# Patient Record
Sex: Female | Born: 1955 | Race: White | Hispanic: No | Marital: Married | Smoking: Never smoker
Health system: Southern US, Community
[De-identification: ages and names within clinical notes are randomized; demographics above are authoritative.]

## PROBLEM LIST (undated history)

## (undated) DIAGNOSIS — D219 Benign neoplasm of connective and other soft tissue, unspecified: Secondary | ICD-10-CM

## (undated) DIAGNOSIS — M1990 Unspecified osteoarthritis, unspecified site: Secondary | ICD-10-CM

## (undated) DIAGNOSIS — IMO0002 Reserved for concepts with insufficient information to code with codable children: Secondary | ICD-10-CM

## (undated) DIAGNOSIS — K219 Gastro-esophageal reflux disease without esophagitis: Secondary | ICD-10-CM

## (undated) HISTORY — PX: OTHER SURGICAL HISTORY: SHX169

## (undated) HISTORY — DX: Benign neoplasm of connective and other soft tissue, unspecified: D21.9

## (undated) HISTORY — DX: Reserved for concepts with insufficient information to code with codable children: IMO0002

## (undated) HISTORY — DX: Unspecified osteoarthritis, unspecified site: M19.90

## (undated) HISTORY — PX: BREAST CYST EXCISION: SHX579

---

## 1999-03-16 ENCOUNTER — Encounter: Payer: Self-pay | Admitting: Gynecology

## 1999-03-16 ENCOUNTER — Encounter: Admission: RE | Admit: 1999-03-16 | Discharge: 1999-03-16 | Payer: Self-pay | Admitting: Gynecology

## 1999-05-19 ENCOUNTER — Encounter: Payer: Self-pay | Admitting: Gynecology

## 1999-05-19 ENCOUNTER — Encounter: Admission: RE | Admit: 1999-05-19 | Discharge: 1999-05-19 | Payer: Self-pay | Admitting: Gynecology

## 1999-11-28 ENCOUNTER — Encounter: Admission: RE | Admit: 1999-11-28 | Discharge: 1999-11-28 | Payer: Self-pay | Admitting: Gynecology

## 1999-11-28 ENCOUNTER — Encounter: Payer: Self-pay | Admitting: Gynecology

## 2000-12-18 ENCOUNTER — Encounter: Admission: RE | Admit: 2000-12-18 | Discharge: 2000-12-18 | Payer: Self-pay | Admitting: Gynecology

## 2000-12-18 ENCOUNTER — Encounter: Payer: Self-pay | Admitting: Gynecology

## 2001-05-20 ENCOUNTER — Other Ambulatory Visit: Admission: RE | Admit: 2001-05-20 | Discharge: 2001-05-20 | Payer: Self-pay | Admitting: Gynecology

## 2001-12-23 ENCOUNTER — Encounter: Payer: Self-pay | Admitting: Gynecology

## 2001-12-23 ENCOUNTER — Encounter: Admission: RE | Admit: 2001-12-23 | Discharge: 2001-12-23 | Payer: Self-pay | Admitting: Gynecology

## 2002-06-16 ENCOUNTER — Other Ambulatory Visit: Admission: RE | Admit: 2002-06-16 | Discharge: 2002-06-16 | Payer: Self-pay | Admitting: Gynecology

## 2003-01-06 ENCOUNTER — Encounter: Admission: RE | Admit: 2003-01-06 | Discharge: 2003-01-06 | Payer: Self-pay | Admitting: Gynecology

## 2003-05-07 ENCOUNTER — Encounter: Admission: RE | Admit: 2003-05-07 | Discharge: 2003-05-07 | Payer: Self-pay | Admitting: Orthopedic Surgery

## 2003-05-20 ENCOUNTER — Encounter: Admission: RE | Admit: 2003-05-20 | Discharge: 2003-05-20 | Payer: Self-pay | Admitting: Orthopedic Surgery

## 2003-10-12 ENCOUNTER — Other Ambulatory Visit: Admission: RE | Admit: 2003-10-12 | Discharge: 2003-10-12 | Payer: Self-pay | Admitting: Gynecology

## 2004-01-21 ENCOUNTER — Ambulatory Visit: Payer: Self-pay | Admitting: Internal Medicine

## 2004-02-11 ENCOUNTER — Encounter: Admission: RE | Admit: 2004-02-11 | Discharge: 2004-02-11 | Payer: Self-pay | Admitting: Gynecology

## 2004-03-02 ENCOUNTER — Encounter: Admission: RE | Admit: 2004-03-02 | Discharge: 2004-03-02 | Payer: Self-pay | Admitting: Gynecology

## 2004-09-25 ENCOUNTER — Other Ambulatory Visit: Admission: RE | Admit: 2004-09-25 | Discharge: 2004-09-25 | Payer: Self-pay | Admitting: Gynecology

## 2004-10-06 ENCOUNTER — Ambulatory Visit: Payer: Self-pay | Admitting: Internal Medicine

## 2004-10-23 ENCOUNTER — Ambulatory Visit: Payer: Self-pay | Admitting: Endocrinology

## 2004-12-07 ENCOUNTER — Ambulatory Visit: Payer: Self-pay | Admitting: Endocrinology

## 2004-12-13 ENCOUNTER — Ambulatory Visit: Payer: Self-pay | Admitting: Endocrinology

## 2005-05-03 ENCOUNTER — Encounter: Admission: RE | Admit: 2005-05-03 | Discharge: 2005-05-03 | Payer: Self-pay | Admitting: Gynecology

## 2005-07-16 ENCOUNTER — Ambulatory Visit: Payer: Self-pay | Admitting: Internal Medicine

## 2005-10-02 ENCOUNTER — Other Ambulatory Visit: Admission: RE | Admit: 2005-10-02 | Discharge: 2005-10-02 | Payer: Self-pay | Admitting: Gynecology

## 2006-06-13 ENCOUNTER — Encounter: Admission: RE | Admit: 2006-06-13 | Discharge: 2006-06-13 | Payer: Self-pay | Admitting: Gynecology

## 2006-07-16 ENCOUNTER — Ambulatory Visit: Payer: Self-pay | Admitting: Internal Medicine

## 2006-08-30 ENCOUNTER — Ambulatory Visit: Payer: Self-pay | Admitting: Internal Medicine

## 2006-10-09 ENCOUNTER — Other Ambulatory Visit: Admission: RE | Admit: 2006-10-09 | Discharge: 2006-10-09 | Payer: Self-pay | Admitting: Obstetrics and Gynecology

## 2007-03-06 HISTORY — PX: HAND SURGERY: SHX662

## 2007-07-17 ENCOUNTER — Encounter: Admission: RE | Admit: 2007-07-17 | Discharge: 2007-07-17 | Payer: Self-pay | Admitting: Gynecology

## 2007-07-24 ENCOUNTER — Ambulatory Visit: Payer: Self-pay | Admitting: Endocrinology

## 2007-07-24 DIAGNOSIS — M810 Age-related osteoporosis without current pathological fracture: Secondary | ICD-10-CM

## 2007-07-29 ENCOUNTER — Encounter: Payer: Self-pay | Admitting: Endocrinology

## 2007-07-29 DIAGNOSIS — D72819 Decreased white blood cell count, unspecified: Secondary | ICD-10-CM | POA: Insufficient documentation

## 2007-07-29 DIAGNOSIS — M4120 Other idiopathic scoliosis, site unspecified: Secondary | ICD-10-CM | POA: Insufficient documentation

## 2007-07-29 DIAGNOSIS — J309 Allergic rhinitis, unspecified: Secondary | ICD-10-CM | POA: Insufficient documentation

## 2007-07-29 DIAGNOSIS — Z8719 Personal history of other diseases of the digestive system: Secondary | ICD-10-CM | POA: Insufficient documentation

## 2007-07-29 DIAGNOSIS — J40 Bronchitis, not specified as acute or chronic: Secondary | ICD-10-CM | POA: Insufficient documentation

## 2007-07-30 ENCOUNTER — Ambulatory Visit: Payer: Self-pay | Admitting: Endocrinology

## 2007-07-30 DIAGNOSIS — H9319 Tinnitus, unspecified ear: Secondary | ICD-10-CM | POA: Insufficient documentation

## 2007-07-30 DIAGNOSIS — G4700 Insomnia, unspecified: Secondary | ICD-10-CM | POA: Insufficient documentation

## 2007-08-06 ENCOUNTER — Encounter: Payer: Self-pay | Admitting: Endocrinology

## 2007-08-06 ENCOUNTER — Ambulatory Visit: Payer: Self-pay | Admitting: Internal Medicine

## 2007-08-08 ENCOUNTER — Ambulatory Visit: Payer: Self-pay | Admitting: Gastroenterology

## 2007-08-14 ENCOUNTER — Telehealth (INDEPENDENT_AMBULATORY_CARE_PROVIDER_SITE_OTHER): Payer: Self-pay | Admitting: *Deleted

## 2007-08-15 ENCOUNTER — Telehealth (INDEPENDENT_AMBULATORY_CARE_PROVIDER_SITE_OTHER): Payer: Self-pay | Admitting: *Deleted

## 2007-08-22 ENCOUNTER — Ambulatory Visit: Payer: Self-pay | Admitting: Gastroenterology

## 2007-10-10 ENCOUNTER — Other Ambulatory Visit: Admission: RE | Admit: 2007-10-10 | Discharge: 2007-10-10 | Payer: Self-pay | Admitting: Gynecology

## 2007-12-09 ENCOUNTER — Ambulatory Visit: Payer: Self-pay | Admitting: Endocrinology

## 2008-02-16 ENCOUNTER — Ambulatory Visit: Payer: Self-pay | Admitting: Endocrinology

## 2008-02-23 ENCOUNTER — Ambulatory Visit (HOSPITAL_BASED_OUTPATIENT_CLINIC_OR_DEPARTMENT_OTHER): Admission: RE | Admit: 2008-02-23 | Discharge: 2008-02-23 | Payer: Self-pay | Admitting: *Deleted

## 2008-03-31 ENCOUNTER — Telehealth (INDEPENDENT_AMBULATORY_CARE_PROVIDER_SITE_OTHER): Payer: Self-pay | Admitting: *Deleted

## 2008-04-12 ENCOUNTER — Telehealth (INDEPENDENT_AMBULATORY_CARE_PROVIDER_SITE_OTHER): Payer: Self-pay | Admitting: *Deleted

## 2008-06-03 ENCOUNTER — Telehealth (INDEPENDENT_AMBULATORY_CARE_PROVIDER_SITE_OTHER): Payer: Self-pay | Admitting: *Deleted

## 2008-06-09 ENCOUNTER — Other Ambulatory Visit: Admission: RE | Admit: 2008-06-09 | Discharge: 2008-06-09 | Payer: Self-pay | Admitting: Gynecology

## 2008-06-09 ENCOUNTER — Ambulatory Visit: Payer: Self-pay | Admitting: Gynecology

## 2008-06-09 ENCOUNTER — Encounter: Payer: Self-pay | Admitting: Gynecology

## 2008-06-11 ENCOUNTER — Ambulatory Visit: Payer: Self-pay | Admitting: Internal Medicine

## 2008-06-17 ENCOUNTER — Encounter: Payer: Self-pay | Admitting: Internal Medicine

## 2008-06-23 ENCOUNTER — Ambulatory Visit: Payer: Self-pay | Admitting: Gynecology

## 2008-06-23 ENCOUNTER — Other Ambulatory Visit: Admission: RE | Admit: 2008-06-23 | Discharge: 2008-06-23 | Payer: Self-pay | Admitting: Gynecology

## 2008-06-23 ENCOUNTER — Encounter: Payer: Self-pay | Admitting: Gynecology

## 2008-06-24 ENCOUNTER — Encounter: Admission: RE | Admit: 2008-06-24 | Discharge: 2008-06-24 | Payer: Self-pay | Admitting: Gynecology

## 2008-10-03 DIAGNOSIS — D219 Benign neoplasm of connective and other soft tissue, unspecified: Secondary | ICD-10-CM

## 2008-10-03 HISTORY — DX: Benign neoplasm of connective and other soft tissue, unspecified: D21.9

## 2008-10-11 ENCOUNTER — Ambulatory Visit: Payer: Self-pay | Admitting: Women's Health

## 2008-10-11 ENCOUNTER — Other Ambulatory Visit: Admission: RE | Admit: 2008-10-11 | Discharge: 2008-10-11 | Payer: Self-pay | Admitting: Obstetrics and Gynecology

## 2008-10-11 ENCOUNTER — Encounter: Payer: Self-pay | Admitting: Women's Health

## 2008-10-20 ENCOUNTER — Ambulatory Visit: Payer: Self-pay | Admitting: Women's Health

## 2008-12-21 ENCOUNTER — Ambulatory Visit: Payer: Self-pay | Admitting: Endocrinology

## 2008-12-21 DIAGNOSIS — R05 Cough: Secondary | ICD-10-CM

## 2008-12-21 DIAGNOSIS — R059 Cough, unspecified: Secondary | ICD-10-CM | POA: Insufficient documentation

## 2009-06-27 ENCOUNTER — Encounter: Admission: RE | Admit: 2009-06-27 | Discharge: 2009-06-27 | Payer: Self-pay | Admitting: Gynecology

## 2009-08-19 ENCOUNTER — Ambulatory Visit: Payer: Self-pay | Admitting: Endocrinology

## 2009-08-20 LAB — CONVERTED CEMR LAB
Alkaline Phosphatase: 65 units/L (ref 39–117)
BUN: 26 mg/dL — ABNORMAL HIGH (ref 6–23)
Basophils Absolute: 0 10*3/uL (ref 0.0–0.1)
Bilirubin, Direct: 0.2 mg/dL (ref 0.0–0.3)
CO2: 32 meq/L (ref 19–32)
Calcium: 10.1 mg/dL (ref 8.4–10.5)
Eosinophils Relative: 3 % (ref 0.0–5.0)
GFR calc non Af Amer: 82.73 mL/min (ref >60)
Hemoglobin, Urine: NEGATIVE
Hemoglobin: 15.1 g/dL — ABNORMAL HIGH (ref 12.0–15.0)
Ketones, ur: NEGATIVE mg/dL
Leukocytes, UA: NEGATIVE
MCHC: 34.4 g/dL (ref 30.0–36.0)
MCV: 93.9 fL (ref 78.0–100.0)
Monocytes Absolute: 0.6 10*3/uL (ref 0.1–1.0)
Monocytes Relative: 13.5 % — ABNORMAL HIGH (ref 3.0–12.0)
Neutro Abs: 2.1 10*3/uL (ref 1.4–7.7)
Nitrite: NEGATIVE
Potassium: 5.1 meq/L (ref 3.5–5.1)
Sodium: 142 meq/L (ref 135–145)
TSH: 1.96 microintl units/mL (ref 0.35–5.50)
Total Bilirubin: 0.9 mg/dL (ref 0.3–1.2)
Total CHOL/HDL Ratio: 2
Total Protein, Urine: NEGATIVE mg/dL
Total Protein: 6.6 g/dL (ref 6.0–8.3)
WBC: 4.8 10*3/uL (ref 4.5–10.5)
pH: 6.5 (ref 5.0–8.0)

## 2009-08-26 ENCOUNTER — Ambulatory Visit: Payer: Self-pay | Admitting: Endocrinology

## 2009-08-26 DIAGNOSIS — M25569 Pain in unspecified knee: Secondary | ICD-10-CM

## 2009-10-12 ENCOUNTER — Other Ambulatory Visit: Admission: RE | Admit: 2009-10-12 | Discharge: 2009-10-12 | Payer: Self-pay | Admitting: Gynecology

## 2009-10-12 ENCOUNTER — Ambulatory Visit: Payer: Self-pay | Admitting: Women's Health

## 2009-11-10 ENCOUNTER — Ambulatory Visit: Payer: Self-pay | Admitting: Endocrinology

## 2009-11-10 DIAGNOSIS — R197 Diarrhea, unspecified: Secondary | ICD-10-CM | POA: Insufficient documentation

## 2010-03-27 ENCOUNTER — Encounter: Payer: Self-pay | Admitting: Gynecology

## 2010-04-06 NOTE — Assessment & Plan Note (Signed)
Summary: cpx / [facility]   Vital Signs:  Patient profile:   55 year old female Height:      67 inches (170.18 cm) Weight:      133.50 pounds (60.68 kg) BMI:     20.98 O2 Sat:      94 % on Room air Temp:     97.3 degrees F (36.28 degrees C) oral Pulse rate:   66 / minute BP sitting:   124 / 74  (left arm) Cuff size:   regular  Vitals Entered By: Brenton Grills MA (August 26, 2009 11:14 AM)  O2 Flow:  Room air CC: CPX/pt states she is no longer taking the azithromycin and needs refills on nasonex and astepro/aj   Primary Provider:  Romero Belling  CC:  CPX/pt states she is no longer taking the azithromycin and needs refills on nasonex and astepro/aj.  History of Present Illness: here for regular wellness examination.  she's feeling pretty well in general, and does not drink or smoke.  Current Medications (verified): 1)  Hyoscyamine Sulfate 0.125 Mg Tabs (Hyoscyamine Sulfate) .... Q4h As Needed Cramps 2)  Naproxen 500 Mg  Tabs (Naproxen) .... Take 2 By Mouth Qd 3)  Climara 0.0375 Mg/24hr  Ptwk (Estradiol) .... Use As Directed 4)  Trazodone Hcl 50 Mg Tabs (Trazodone Hcl) .... At Bedtime As Needed Sleep 5)  Zyrtec Allergy 10 Mg  Tabs (Cetirizine Hcl) .... Take 1 By Mouth Once Daily Prn 6)  Nasonex 50 Mcg/act Susp (Mometasone Furoate) .Marland Kitchen.. 1-2 Sprays Each Nostril Daily- Steroid 7)  Astepro 0.15 % Soln (Azelastine Hcl) .Marland Kitchen.. 1-2 Sprays Each Nostril Two Times A Day As Needed 8)  Azithromycin 500 Mg Tabs (Azithromycin) .Marland Kitchen.. 1 Qd 9)  Promethazine-Codeine 6.25-10 Mg/5ml Syrp (Promethazine-Codeine) .Marland Kitchen.. 1 Tsp Q4h As Needed Cough 10)  Multivitamins  Tabs (Multiple Vitamin) .Marland Kitchen.. 1 Tab Once Daily 11)  Vitamin C 1000 Mg Tabs (Ascorbic Acid) .Marland Kitchen.. 1 Tab Once Daily 12)  Calcium 1200-1000 Mg-Unit Chew (Calcium Carbonate-Vit D-Min) .... Once Daily 13)  Vitamin D3 400 Unit Tabs (Cholecalciferol) .... Once Daily 14)  Glucosamine Complex  Tabs (Nutritional Supplements) .... Once Daily 15)  Prometrium 2.5mg   Caps (Progesterone Micronized) .Marland Kitchen.. 1 Cap Once Daily  Allergies (verified): 1)  ! Ampicillin  Past History:  Past Medical History: Last updated: 07/30/2007 ASYMPTOMATIC POSTMENOPAUSAL STATUS (ICD-V49.81) ROUTINE GENERAL MEDICAL EXAM@HEALTH  CARE FACL (ICD-V70.0) TINNITUS, RIGHT (ICD-388.30) LEUKOPENIA, MILD (ICD-288.50) IRRITABLE BOWEL SYNDROME, HX OF (ICD-V12.79) SCOLIOSIS (ICD-737.30) BRONCHITIS (ICD-490) ALLERGIC RHINITIS (ICD-477.9)  Family History: Reviewed history from 07/30/2007 and no changes required. no cancer  Social History: Reviewed history from 07/30/2007 and no changes required. 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    Physical Exam  General:  normal appearance.   Head:  head: no deformity eyes: no periorbital swelling, no proptosis external nose and ears are normal mouth: no lesion seen Neck:  Supple without thyroid enlargement or tenderness.  Breasts:  sees gyn  Lungs:  Clear to auscultation bilaterally. Normal respiratory effort.  Heart:  Regular rate and rhythm without murmurs or gallops noted. Normal S1,S2.   Abdomen:  abdomen is soft, nontender.  no hepatosplenomegaly.   not distended.  no hernia  Rectal:  sees gyn  Genitalia:  sees gyn  Msk:  right knee is normal to my exam  Pulses:  dorsalis pedis intact bilat.  no carotid bruit  Extremities:  no deformity.  no ulcer on the feet.  feet are of normal color and temp.  no edema there are healed surgical scars at the left foot (resection of neuromas) Neurologic:  cn 2-12 grossly intact.   readily moves all 4's.   sensation is intact to touch on the feet  Skin:  normal texture and temp.   no rash.  not diaphoretic  Cervical Nodes:  No significant adenopathy.  Psych:  Alert and cooperative; normal mood and affect; normal attention span and concentration.     Impression & Recommendations:  Problem # 1:  ROUTINE GENERAL MEDICAL EXAM@HEALTH  CARE FACL (ICD-V70.0)  Medications Added to Medication List This Visit: 1)  Multivitamins Tabs (Multiple vitamin) .Marland Kitchen.. 1 tab once daily 2)  Vitamin C 1000 Mg Tabs (Ascorbic acid) .Marland Kitchen.. 1 tab once daily 3)  Calcium 1200-1000 Mg-unit Chew (Calcium carbonate-vit d-min) .... Once daily 4)  Vitamin D3 400 Unit Tabs (Cholecalciferol) .... Once daily 5)  Glucosamine Complex Tabs (Nutritional supplements) .... Once daily 6)  Prometrium 2.5mg  Caps (progesterone Micronized)  .Marland Kitchen.. 1 cap once daily 7)  Carisoprodol 350 Mg Tabs (Carisoprodol) .Marland Kitchen.. 1 three times a day as needed for pain  Other Orders: EKG w/ Interpretation (93000) T-Knee Comp Right 4 Views (73564TC) Est. Patient 40-64 years (04540)  Preventive Care Screening     gyn is dr young [facility]in[facility])   Patient Instructions: 1)  please consider these measures for your health:  minimize alcohol.  do not use tobacco products.  have a colonoscopy at least every 10 years from age 70.  keep firearms safely stored.  always use seat belts.  have working smoke alarms in your home.  see the dentist regularly.  never drive under the influence of alcohol or drugs (including prescription drugs).  those with fair skin should take precautions against the sun. 2)  x ray today.  please call [phone] to hear your test results. 3)  please see dr dean for your knee pain. Prescriptions: CARISOPRODOL 350 MG TABS (CARISOPRODOL) 1 three times a day as needed for pain  #50 x 2   Entered and Authorized by:   Minus Breeding MD   Signed by:   Minus Breeding MD on 08/26/2009   Method used:   Electronically to        [facility] [phone]* (retail)       [address] [place]  [address]        Ph: [phone] or [phone]       Fax: [phone]   RxID:   [id number] ASTEPRO 0.15 % SOLN (AZELASTINE HCL) 1-2 sprays each nostril two times a day as needed  #1 x PRN   Entered and Authorized by:   Minus Breeding MD   Signed by:   Minus Breeding MD on 08/26/2009   Method used:   Electronically to        [facility] [phone]* (retail)       [address] [place]  [address]       Ph: [phone] or [phone]       Fax: [phone]   RxID:   [id number] NASONEX 50 MCG/ACT SUSP (MOMETASONE FUROATE) 1-2 sprays each nostril daily- steroid  #1 x prn  Entered and Authorized by:   Minus Breeding MD   Signed by:   Minus Breeding MD on 08/26/2009   Method used:   Electronically to        [facility] [phone]* (retail)       [address]       [place], [place]  [address]       Ph: [phone] or [phone]       Fax: [phone]   RxID:   [id number]

## 2010-04-06 NOTE — Assessment & Plan Note (Signed)
Summary: not feeling well/lower intestine issues/lb   Vital Signs:  Patient profile:   55 year old female Height:      67 inches (170.18 cm) Weight:      133.50 pounds (60.68 kg) BMI:     20.98 O2 Sat:      95 % on Room air Temp:     97.0 degrees F (36.11 degrees C) oral Pulse rate:   60 / minute BP sitting:   118 / 76  (left arm) Cuff size:   regular  Vitals Entered By: Brenton Grills MA (November 10, 2009 11:40 AM)  O2 Flow:  Room air CC: stomach cramps x 3 days/aj   Primary Provider:  Romero Belling  CC:  stomach cramps x 3 days/aj.  History of Present Illness: 3 days of moderate cramps in the abdomen, and associated diarrhea.  she has h/o ibs, but that has been better recently.  no one else close to pt is ill.     Current Medications (verified): 1)  Hyoscyamine Sulfate 0.125 Mg Tabs (Hyoscyamine Sulfate) .... Q4h As Needed Cramps 2)  Naproxen 500 Mg  Tabs (Naproxen) .... Take 2 By Mouth Qd 3)  Climara 0.0375 Mg/24hr  Ptwk (Estradiol) .... Use As Directed 4)  Trazodone Hcl 50 Mg Tabs (Trazodone Hcl) .... At Bedtime As Needed Sleep 5)  Zyrtec Allergy 10 Mg  Tabs (Cetirizine Hcl) .... Take 1 By Mouth Once Daily Prn 6)  Nasonex 50 Mcg/act Susp (Mometasone Furoate) .Marland Kitchen.. 1-2 Sprays Each Nostril Daily- Steroid 7)  Astepro 0.15 % Soln (Azelastine Hcl) .Marland Kitchen.. 1-2 Sprays Each Nostril Two Times A Day As Needed 8)  Multivitamins  Tabs (Multiple Vitamin) .Marland Kitchen.. 1 Tab Once Daily 9)  Vitamin C 1000 Mg Tabs (Ascorbic Acid) .Marland Kitchen.. 1 Tab Once Daily 10)  Calcium 1200-1000 Mg-Unit Chew (Calcium Carbonate-Vit D-Min) .... Once Daily 11)  Vitamin D3 400 Unit Tabs (Cholecalciferol) .... Once Daily 12)  Glucosamine Complex  Tabs (Nutritional Supplements) .... Once Daily 13)  Prometrium 2.5mg  Caps (Progesterone Micronized) .Marland Kitchen.. 1 Cap Once Daily 14)  Carisoprodol 350 Mg Tabs (Carisoprodol) .Marland Kitchen.. 1 Three Times A Day As Needed For Pain  Allergies (verified): 1)  ! Ampicillin  Past History:  Past  Medical History: Last updated: 07/30/2007 ASYMPTOMATIC POSTMENOPAUSAL STATUS (ICD-V49.81) ROUTINE GENERAL MEDICAL EXAM@HEALTH  CARE FACL (ICD-V70.0) TINNITUS, RIGHT (ICD-388.30) LEUKOPENIA, MILD (ICD-288.50) IRRITABLE BOWEL SYNDROME, HX OF (ICD-V12.79) SCOLIOSIS (ICD-737.30) BRONCHITIS (ICD-490) ALLERGIC RHINITIS (ICD-477.9)  Review of Systems  The patient denies weight loss and hematochezia.         denies n/v  Physical Exam  General:  normal appearance.   Abdomen:  abdomen is soft, nontender.  no hepatosplenomegaly.   not distended.  no hernia    Impression & Recommendations:  Problem # 1:  DIARRHEA (ICD-787.91) usually viral  Other Orders: Est. Patient Level III ([address])  Patient Instructions: 1)  take your hyoscyamine as needed for cramps. 2)  call next week if not better.   Prescriptions: HYOSCYAMINE SULFATE 0.125 MG TABS (HYOSCYAMINE SULFATE) q4h as needed cramps  #30 x 11   Entered and Authorized by:   Minus Breeding MD   Signed by:   Minus Breeding MD on 11/10/2009   Method used:   [facility]ron[facility] to        [facility] [phone]* (retail)       [address]       [place], [place]  [address]       Ph:  [phone] or [phone]       Fax: [phone]   RxID:   [id number]

## 2010-06-20 ENCOUNTER — Other Ambulatory Visit: Payer: Self-pay | Admitting: Gynecology

## 2010-06-20 DIAGNOSIS — Z1231 Encounter for screening mammogram for malignant neoplasm of breast: Secondary | ICD-10-CM

## 2010-06-29 ENCOUNTER — Ambulatory Visit
Admission: RE | Admit: 2010-06-29 | Discharge: 2010-06-29 | Disposition: A | Discharge status: Home / Self Care | Payer: BC Managed Care – PPO | Source: Ambulatory Visit | Attending: Gynecology | Admitting: Gynecology

## 2010-06-29 DIAGNOSIS — Z1231 Encounter for screening mammogram for malignant neoplasm of breast: Secondary | ICD-10-CM

## 2010-07-18 NOTE — Op Note (Signed)
Melissa [name], HOGUE            ACCOUNT NO.:  [id number]   MEDICAL RECORD NO.:  [id number]          PATIENT TYPE:  AMB   LOCATION:  DSC                          FACILITY:  [facility]   PHYSICIAN:  Lowell Bouton, M.D.DATE OF BIRTH:  [birth year]   DATE OF PROCEDURE:  02/23/2008  DATE OF DISCHARGE:                               OPERATIVE REPORT   PREOPERATIVE DIAGNOSIS:  Carpometacarpal arthritis, left thumb.   POSTOPERATIVE DIAGNOSIS:  Carpometacarpal arthritis, left thumb.   PROCEDURE:  Excision of trapezium with tendon interposition arthroplasty  using half of FCR tendon left thumb.   SURGEON:  Lowell Bouton, MD   ANESTHESIA:  General augmented with a supraclavicular block.   PROCEDURE:  Under general anesthesia with a tourniquet on the left arm,  the left hand was prepped and draped in the usual fashion.  After  exsanguinating the limb the tourniquet was inflated to 250 mmHg.  A  longitudinal gently curved incision was made over the dorsum of the  carpometacarpal joint of the thumb and was carried down through the  subcutaneous tissues and care was taken to protect the dorsal sensory  branch of the nerve.  Bleeding points were coagulated.  Sharp dissection  was carried down between the EPL and the tendons of the first dorsal  compartment, and the capsule of the joint was incised longitudinally.  The capsule was then elevated off of the first metacarpal and the  trapezium.  A Glorious Peach was placed in the [facility] joint, and there were severe  articular changes.  The trapezioscaphoid joint also had some mild  articular changes.  An osteotome was used to divide the trapezium into  quarters, and they were removed piecemeal with a rongeur, taking care to  protect the FCR tendon in the base of the wound.  After completely  excising the trapezium and any osteophytes that were present, the tendon  was harvested.  Three short transverse incisions were made over the  volar  aspect of the FCR tendon, and the ulnar half of the tendon was  divided in the proximal wound.  A 4-0 Mersilene was placed in the end of  the tendon to pass it from proximal to distal.  The half of the tendon  was then brought into the defect where the trapezium was, and a drill  hole was made through the base of the first metacarpal after roughing up  the end of the bone.  This was done with a rongeur.  A Swanson suture  passer was used to pass the half of the tendon through the drill hole  from the volar ulnar aspect to the dorsal radial aspect after drilling  with multiple size drill bits.  The tendon was then brought around the  base of the metacarpal and secured on to itself with a 4-0 Mersilene  suture to support the first metacarpal.  The remainder of the half of  the FCR tendon was then rolled into an anchovy and 4-0 Mersilene suture  was used to secure it into the defect where the trapezium had been  removed.  The wound was then copiously irrigated.  The volar wounds were  closed with 4-0 nylon sutures.  The dorsal wound was closed with a 4-0  Vicryl in the capsule and a 3-0 subcuticular Prolene in the skin.  Steri-  Strips were applied followed by sterile dressings and a thumb spica  splint.  The tourniquet was released with good circulation of the hand.  The patient went to the recovery room awake in stable in good condition.     Lowell Bouton, M.D.  Electronically Signed    EMM/MEDQ  D:  02/23/2008  T:  02/23/2008  Job:  [id number]

## 2010-07-18 NOTE — Assessment & Plan Note (Signed)
[facility]                             PULMONARY OFFICE NOTE   Melissa [name], Melissa [name]                   MRN:          [id number]  DATE:07/16/2006                            DOB:          1955/05/24    ALLERGY FOLLOWUP:   PRIMARY PHYSICIAN:  Sean A. Everardo All, M.D.   PROBLEM LIST:  1. Allergic rhinitis.  2. Bronchitis.  3. Scoliosis.   HISTORY:  She and her husband have been sick for ten days sharing a  cold.  She has been sneezing, eyes running, chest congested, no fever or  sore throat, bringing up some mucus.  She is taking Mucinex and started  Allegra then switched to Zyrtec and occasional Sudafed.   MEDICATIONS:  1. Hormone patch.  2. Antihistamine p.r.n.   DRUG INTOLERANT:  AMPICILLIN WITH RASH.   PHYSICAL EXAMINATION:  VITAL SIGNS:  Weight 134 pounds, BP 108/80, pulse  75, room air saturation 99%.  LUNGS:  Mild chest congestion and nasal congestion.  Turbinate edema.  Clear mucus bridging.  No adenopathy.  HEENT:  Conjunctivae are not injected.  Throat is not red.  HEART:  Sounds are regular.   IMPRESSION:  Rhinitis and bronchitis, viral versus allergic.   PLAN:  1. Neo-Synephrine inhalation, Depo 80, Z-Pak, Mucinex, fluids and      supportive care.  2. For now schedule return p.r.n.     Clinton D. Maple Hudson, MD, FCCP, FACP     CDY/MedQ  DD: 07/18/2006  DT: 07/18/2006  Job #: [id number]

## 2010-10-18 ENCOUNTER — Ambulatory Visit (INDEPENDENT_AMBULATORY_CARE_PROVIDER_SITE_OTHER): Payer: BC Managed Care – PPO | Admitting: Women's Health

## 2010-10-18 ENCOUNTER — Encounter: Payer: Self-pay | Admitting: Women's Health

## 2010-10-18 ENCOUNTER — Other Ambulatory Visit (HOSPITAL_COMMUNITY)
Admission: RE | Admit: 2010-10-18 | Discharge: 2010-10-18 | Disposition: A | Discharge status: Home / Self Care | Payer: BC Managed Care – PPO | Source: Ambulatory Visit | Attending: Gynecology | Admitting: Gynecology

## 2010-10-18 VITALS — BP 110/60 | Ht 67.0 in | Wt 138.0 lb

## 2010-10-18 DIAGNOSIS — Z01419 Encounter for gynecological examination (general) (routine) without abnormal findings: Secondary | ICD-10-CM

## 2010-10-18 NOTE — Progress Notes (Signed)
Melissa [name] 05/20/1955 [id number]    History:    The patient presents for annual exam.  History of IBS, and fibroids. No complaints.    Past medical history, past surgical history, family history and social history were all reviewed and documented in the EPIC chart.   ROS:  A  ROS was performed and pertinent positives and negatives are included in the history.  Exam:  Filed Vitals:   10/18/10 1606  BP: 110/60    General appearance:  Normal Head/Neck:  Normal, without cervical or supraclavicular adenopathy. Thyroid:  Symmetrical, normal in size, without palpable masses or nodularity. Respiratory  Effort:  Normal  Auscultation:  Clear without wheezing or rhonchi Cardiovascular  Auscultation:  Regular rate, without rubs, murmurs or gallops  Edema/varicosities:  Not grossly evident Abdominal   Soft,nontender, without masses, guarding or rebound.  Liver/spleen:  No organomegaly noted  Hernia:  None appreciated  Occult test:   Skin  Inspection:  Grossly normal  Palpation:  Grossly normal Neurologic/psychiatric  Orientation:  Normal with appropriate conversation.  Mood/affect:  Normal  Genitourinary    Breasts: Examined lying and sitting.     Right: Without masses, retractions, discharge or axillary adenopathy.     Left: Without masses, retractions, discharge or axillary adenopathy.   Inguinal/mons:  Normal without inguinal adenopathy  External genitalia:  Normal  BUS/Urethra/Skene's glands:  Normal  Bladder:  Normal  Vagina:  Normal  Cervix:  Normal/stenotic  Uterus:  Fibroids, 8 wk size.  Midline and mobile  Adnexa/parametria:     Rt: Without masses or tenderness.   Lt: Without masses or tenderness.  Anus and perineum: Normal  Digital rectal exam: Normal sphincter tone without palpated masses or tenderness  Assessment/Plan:  55 y.o.MWF G0 female for annual exam.   Postmenopausal on no HRT and no bleeding. She had used HRT last year and states did stop, has  occasional hot flushes but is doing well. She had a normal DEXA  In 2012, labs done at [facility] and states  they  were normal. Had a normal colonoscopy in 2008. SBEs encouraged had a normal mammogram April 2012. Encouraged exercise, vitamin D 2000 daily, calcium rich diet. Pap only today.    Harrington Challenger [facility], 4:46 PM 10/18/2010

## 2010-10-31 ENCOUNTER — Other Ambulatory Visit (INDEPENDENT_AMBULATORY_CARE_PROVIDER_SITE_OTHER): Payer: BC Managed Care – PPO

## 2010-10-31 ENCOUNTER — Telehealth: Payer: Self-pay | Admitting: *Deleted

## 2010-10-31 DIAGNOSIS — Z0000 Encounter for general adult medical examination without abnormal findings: Secondary | ICD-10-CM

## 2010-10-31 LAB — URINALYSIS, ROUTINE W REFLEX MICROSCOPIC
Hgb urine dipstick: NEGATIVE
Nitrite: NEGATIVE
Total Protein, Urine: NEGATIVE
Urobilinogen, UA: 0.2 (ref 0.0–1.0)

## 2010-10-31 LAB — CBC WITH DIFFERENTIAL/PLATELET
Eosinophils Relative: 2.6 % (ref 0.0–5.0)
Lymphocytes Relative: 33.4 % (ref 12.0–46.0)
MCV: 93.8 fl (ref 78.0–100.0)
Monocytes Absolute: 0.5 10*3/uL (ref 0.1–1.0)
Monocytes Relative: 11.2 % (ref 3.0–12.0)
Neutrophils Relative %: 52 % (ref 43.0–77.0)
Platelets: 169 10*3/uL (ref 150.0–400.0)
RBC: 4.51 Mil/uL (ref 3.87–5.11)
WBC: 4.4 10*3/uL — ABNORMAL LOW (ref 4.5–10.5)

## 2010-10-31 LAB — HEPATIC FUNCTION PANEL
ALT: 20 U/L (ref 0–35)
AST: 24 U/L (ref 0–37)
Albumin: 4.2 g/dL (ref 3.5–5.2)
Total Protein: 6.5 g/dL (ref 6.0–8.3)

## 2010-10-31 LAB — BASIC METABOLIC PANEL
Chloride: 103 mEq/L (ref 96–112)
GFR: 77.7 mL/min (ref >60.00)
Glucose, Bld: 53 mg/dL — ABNORMAL LOW (ref 70–99)
Potassium: 4.6 mEq/L (ref 3.5–5.1)
Sodium: 142 mEq/L (ref 135–145)

## 2010-10-31 LAB — LIPID PANEL
Cholesterol: 163 mg/dL (ref 0–200)
HDL: 74.6 mg/dL (ref >39.00)
LDL Cholesterol: 82 mg/dL (ref 0–99)
Triglycerides: 32 mg/dL (ref 0.0–149.0)
VLDL: 6.4 mg/dL (ref 0.0–40.0)

## 2010-10-31 LAB — TSH: TSH: 1.68 u[IU]/mL (ref 0.35–5.50)

## 2010-10-31 NOTE — Telephone Encounter (Signed)
Pt here for labs for upcoming appointment. Labs entered into [employer]

## 2010-11-09 ENCOUNTER — Ambulatory Visit (INDEPENDENT_AMBULATORY_CARE_PROVIDER_SITE_OTHER): Payer: BC Managed Care – PPO | Admitting: Endocrinology

## 2010-11-09 ENCOUNTER — Other Ambulatory Visit: Payer: Self-pay | Admitting: *Deleted

## 2010-11-09 ENCOUNTER — Encounter: Payer: Self-pay | Admitting: Endocrinology

## 2010-11-09 VITALS — BP 104/72 | HR 80 | Temp 97.9°F | Ht 67.0 in | Wt 138.0 lb

## 2010-11-09 DIAGNOSIS — Z0000 Encounter for general adult medical examination without abnormal findings: Secondary | ICD-10-CM

## 2010-11-09 DIAGNOSIS — R1310 Dysphagia, unspecified: Secondary | ICD-10-CM | POA: Insufficient documentation

## 2010-11-09 DIAGNOSIS — Z23 Encounter for immunization: Secondary | ICD-10-CM

## 2010-11-09 DIAGNOSIS — M810 Age-related osteoporosis without current pathological fracture: Secondary | ICD-10-CM

## 2010-11-09 DIAGNOSIS — Z136 Encounter for screening for cardiovascular disorders: Secondary | ICD-10-CM

## 2010-11-09 MED ORDER — TRAZODONE HCL 50 MG PO TABS: ORAL_TABLET | ORAL | Status: DC

## 2010-11-09 MED ORDER — CARISOPRODOL 350 MG PO TABS: ORAL_TABLET | ORAL | Status: DC

## 2010-11-09 NOTE — Progress Notes (Signed)
Subjective:    Patient ID: Melissa [name], female    DOB: 12/13/1955, 55 y.o.   MRN: [id number]  HPI here for regular wellness examination.  she's feeling pretty well in general, and says chronic med probs are stable, except as noted below.  Gyn is nancy young, np. Past Medical History  Diagnosis Date  . Fibroid aug. 2010  . Infertility     Past Surgical History  Procedure Date  . Harrington rods     AGE 69  . Hand surgery 2009    LEFT    History   Social History  . Marital Status: Married    Spouse Name: N/A    Number of Children: N/A  . Years of Education: N/A   Occupational History  . Not on file.   Social History Main Topics  . Smoking status: Never Smoker   . Smokeless tobacco: Never Used  . Alcohol Use: Yes  . Drug Use: No  . Sexually Active: Yes -- Female partner(s)    Birth Control/ Protection: Post-menopausal   Other Topics Concern  . Not on file   Social History Narrative  . No narrative on file    Current Outpatient Prescriptions on File Prior to Visit  Medication Sig Dispense Refill  . Ascorbic Acid (VITAMIN C PO) Take by mouth.        . Cetirizine HCl (ZYRTEC PO) Take by mouth.        . Cholecalciferol (VITAMIN D PO) Take by mouth.        . Cholecalciferol (VITAMIN D-3 PO) Take by mouth.        . Glucosamine-Chondroitin (GLUCOSAMINE CHONDR COMPLEX PO) Take by mouth.        . Multiple Vitamin (MULTIVITAMIN PO) Take by mouth daily.        . Naproxen (NAPROSYN PO) Take by mouth.        . Omega-3 Fatty Acids (FISH OIL PO) Take by mouth.        Marland Kitchen VITAMIN E PO Take by mouth.          Allergies  Allergen Reactions  . Ampicillin     REACTION: Rash    Family History  Problem Relation Age of Onset  . Parkinsonism Sister    LMP 12/18/2003  Review of Systems  Constitutional: Negative for fever and unexpected weight change.  HENT: Negative for hearing loss.   Eyes: Negative for visual disturbance.  Respiratory: Negative for shortness of  breath.   Cardiovascular: Negative for chest pain.  Gastrointestinal: Negative for anal bleeding.  Genitourinary: Negative for hematuria.  Musculoskeletal:       No change in chronic arthralgias  Skin: Negative for rash.  Neurological: Negative for syncope.  Hematological: Does not bruise/bleed easily.  Psychiatric/Behavioral: Negative for dysphoric mood.      Objective:   Physical Exam VS: see vs page GEN: no distress HEAD: head: no deformity eyes: no periorbital swelling, no proptosis external nose and ears are normal mouth: no lesion seen NECK: supple, thyroid is not enlarged CHEST WALL: right posterior chest is prominent (scoliosis) BREASTS:  sees gyn CV: reg rate and rhythm, no murmur ABD: abdomen is soft, nontender.  no hepatosplenomegaly.  not distended.  no hernia GENITALIA:  sees gyn RECTAL: sees gyn MUSCULOSKELETAL: muscle bulk and strength are grossly normal.  no obvious joint swelling.  gait is normal and steady EXTEMITIES: no deformity.  no ulcer on the feet.  feet are of normal color and temp.  no edema.  There is a healed surgical scar at the left thumb mcp area. PULSES: dorsalis pedis intact bilat.  no carotid bruit NEURO:  cn 2-12 grossly intact.   readily moves all 4's.  sensation is intact to touch on the feet SKIN:  Normal texture and temperature.  No rash or suspicious lesion is visible.   NODES:  None palpable at the neck PSYCH: alert, oriented x3.  Does not appear anxious nor depressed.     Assessment & Plan:  Wellness visit today, with problems stable, except as noted.   SEPARATE EVALUATION FOLLOWS--EACH PROBLEM HERE IS NEW, NOT RESPONDING TO TREATMENT, OR POSES SIGNIFICANT RISK TO THE PATIENT'S HEALTH: HISTORY OF THE PRESENT ILLNESS: Pt states few mos of moderate "food getting stuck" at the mid-chest, and assoc pain. PAST MEDICAL HISTORY reviewed and up to date today REVIEW OF SYSTEMS: She does not have gerd sxs PHYSICAL EXAMINATION: VITAL  SIGNS:  See vs page GENERAL: no distress ABDOMEN: abdomen is soft, nontender.  no hepatosplenomegaly.   not distended.  no hernia LAB/XRAY RESULTS: cbc is normal except for leukopenia (chronic) IMPRESSION: Dysphagia, new PLAN: See instruction page

## 2010-11-09 NOTE — Telephone Encounter (Signed)
Pt needs refills of Trazodone and Carisoprodol sent to Pharmacy

## 2010-11-09 NOTE — Patient Instructions (Addendum)
please consider these measures for your health:  minimize alcohol.  do not use tobacco products.  have a colonoscopy at least every 10 years from age 56.  Women should have an annual mammogram from age 60.  keep firearms safely stored.  always use seat belts.  have working smoke alarms in your home.  see an eye doctor and dentist regularly.  never drive under the influence of alcohol or drugs (including prescription drugs).  those with fair skin should take precautions against the sun. please let me know what your wishes would be, if artificial life support measures should become necessary.  it is critically important to prevent falling down (keep floor areas well-lit, dry, and free of loose objects). Let's recheck the bone-density test.  Then please call [phone] to hear your test results.  You will be prompted to enter the 9-digit "MRN" number that appears at the top left of this page, followed by #.  Then you will hear the message. Please return in 1 year.   Refer to a gastroenterologist.  you will receive a phone call, about a day and time for an appointment.

## 2010-11-10 ENCOUNTER — Encounter: Payer: Self-pay | Admitting: Gastroenterology

## 2010-11-13 ENCOUNTER — Ambulatory Visit (INDEPENDENT_AMBULATORY_CARE_PROVIDER_SITE_OTHER)
Admission: RE | Admit: 2010-11-13 | Discharge: 2010-11-13 | Disposition: A | Discharge status: Home / Self Care | Payer: BC Managed Care – PPO | Source: Ambulatory Visit | Attending: Endocrinology | Admitting: Endocrinology

## 2010-11-13 DIAGNOSIS — M810 Age-related osteoporosis without current pathological fracture: Secondary | ICD-10-CM

## 2010-11-18 ENCOUNTER — Other Ambulatory Visit: Payer: Self-pay | Admitting: Endocrinology

## 2010-11-18 DIAGNOSIS — Z0000 Encounter for general adult medical examination without abnormal findings: Secondary | ICD-10-CM

## 2010-11-20 ENCOUNTER — Encounter: Payer: Self-pay | Admitting: Endocrinology

## 2010-12-01 ENCOUNTER — Other Ambulatory Visit: Payer: BC Managed Care – PPO

## 2010-12-06 ENCOUNTER — Encounter: Payer: Self-pay | Admitting: Gastroenterology

## 2010-12-06 ENCOUNTER — Ambulatory Visit (INDEPENDENT_AMBULATORY_CARE_PROVIDER_SITE_OTHER): Payer: BC Managed Care – PPO | Admitting: Gastroenterology

## 2010-12-06 ENCOUNTER — Other Ambulatory Visit: Payer: Self-pay

## 2010-12-06 VITALS — BP 122/70 | HR 69 | Ht 67.0 in | Wt 137.0 lb

## 2010-12-06 DIAGNOSIS — R1319 Other dysphagia: Secondary | ICD-10-CM

## 2010-12-06 MED ORDER — MOMETASONE FUROATE 50 MCG/ACT NA SUSP: 2.0000 | Freq: Every day | NASAL | Status: DC

## 2010-12-06 NOTE — Patient Instructions (Signed)
You have been scheduled for a Upper Endoscopy. See separate instructions.  cc: Romero Belling, MD

## 2010-12-06 NOTE — Progress Notes (Signed)
History of Present Illness: This is a 55 year old female who relates a 2 to 3 month history of intermittent solid food dysphagia. She has had problems mainly with bread and potatoes. She states the food will stick for several minutes and eventually pass. Denies weight loss, abdominal pain, constipation, diarrhea, change in stool caliber, melena, hematochezia, nausea, vomiting, reflux symptoms, chest pain.  Review of Systems: Pertinent positive and negative review of systems were noted in the above HPI section. All other review of systems were otherwise negative.  Current Medications, Allergies, Past Medical History, Past Surgical History, Family History and Social History were reviewed in [employer] record.  Physical Exam: General: Well developed , well nourished, no acute distress Head: Normocephalic and atraumatic Eyes:  sclerae anicteric, EOMI Ears: Normal auditory acuity Mouth: No deformity or lesions Neck: Supple, no masses or thyromegaly Lungs: Clear throughout to auscultation Heart: Regular rate and rhythm; no murmurs, rubs or bruits Abdomen: Soft, non tender and non distended. No masses, hepatosplenomegaly or hernias noted. Normal Bowel sounds Musculoskeletal: Symmetrical with no gross deformities  Skin: No lesions on visible extremities Pulses:  Normal pulses noted Extremities: No clubbing, cyanosis, edema or deformities noted Neurological: Alert oriented x 4, grossly nonfocal Cervical Nodes:  No significant cervical adenopathy Inguinal Nodes: No significant inguinal adenopathy Psychological:  Alert and cooperative. Normal mood and affect  Assessment and Recommendations:  1. Solid food dysphagia. Rule out an esophageal stricture and less likely a neoplasm. Schedule endoscopy. The risks, benefits, and alternatives to endoscopy with possible biopsy and possible dilation were discussed with the patient and they consent to proceed.   2. Irritable bowel  syndrome. Minimal activity. Symptoms respond well to hyoscyamine as needed.

## 2010-12-08 LAB — POCT HEMOGLOBIN-HEMACUE: Hemoglobin: 14.4 g/dL (ref 12.0–15.0)

## 2011-01-04 ENCOUNTER — Encounter: Payer: BC Managed Care – PPO | Admitting: Gastroenterology

## 2011-03-06 HISTORY — PX: OTHER SURGICAL HISTORY: SHX169

## 2011-05-28 ENCOUNTER — Other Ambulatory Visit: Payer: Self-pay | Admitting: Gynecology

## 2011-05-28 DIAGNOSIS — Z1231 Encounter for screening mammogram for malignant neoplasm of breast: Secondary | ICD-10-CM

## 2011-05-30 ENCOUNTER — Other Ambulatory Visit: Payer: Self-pay | Admitting: Endocrinology

## 2011-07-04 ENCOUNTER — Ambulatory Visit
Admission: RE | Admit: 2011-07-04 | Discharge: 2011-07-04 | Disposition: A | Discharge status: Home / Self Care | Payer: 59 | Source: Ambulatory Visit | Attending: Gynecology | Admitting: Gynecology

## 2011-07-04 DIAGNOSIS — Z1231 Encounter for screening mammogram for malignant neoplasm of breast: Secondary | ICD-10-CM

## 2011-09-18 ENCOUNTER — Telehealth: Payer: Self-pay | Admitting: *Deleted

## 2011-09-18 DIAGNOSIS — Z0000 Encounter for general adult medical examination without abnormal findings: Secondary | ICD-10-CM

## 2011-09-18 NOTE — Telephone Encounter (Signed)
Received staff msg pt made cpx for September needing labs entered... 09/18/11@3 :40pm/LMB

## 2011-09-18 NOTE — Telephone Encounter (Signed)
Message copied by Deatra James on Tue Sep 18, 2011  3:39 PM ------      Message from: Etheleen Sia      Created: Mon Sep 17, 2011  2:29 PM      Regarding: PHYSICAL LABS       PHYSICAL LABS FOR SEPT PHYSICAL

## 2011-10-24 ENCOUNTER — Encounter: Payer: Self-pay | Admitting: Women's Health

## 2011-10-24 ENCOUNTER — Ambulatory Visit (INDEPENDENT_AMBULATORY_CARE_PROVIDER_SITE_OTHER): Payer: 59 | Admitting: Women's Health

## 2011-10-24 VITALS — BP 120/70 | Ht 66.75 in | Wt 136.0 lb

## 2011-10-24 DIAGNOSIS — Z01419 Encounter for gynecological examination (general) (routine) without abnormal findings: Secondary | ICD-10-CM

## 2011-10-24 NOTE — Patient Instructions (Signed)

## 2011-10-24 NOTE — Progress Notes (Signed)
Melissa [name] 1955/12/02 [id number]    History:    The patient presents for annual exam.  Postmenopausal with no bleeding stopped HRT 2012. History of normal Paps.  History of benign breast biopsies on right breast in 80 and left breast in 1997. Negative colonoscopy in 2008. DEXA done at [facility] 2012 showed osteopenia T score -1.8 at the hip. History of fibroids.   Past medical history, past surgical history, family history and social history were all reviewed and documented in the EPIC chart. 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 Scoliosis history and arthritis.  ROS:  A  ROS was performed and pertinent positives and negatives are included in the history.  Exam:  Filed Vitals:   10/24/11 1529  BP: 120/70    General appearance:  Scoliosis  Head/Neck:  Normal, without cervical or supraclavicular adenopathy. Thyroid:  Symmetrical, normal in size, without palpable masses or nodularity. Respiratory  Effort:  Normal  Auscultation:  Clear without wheezing or rhonchi Cardiovascular  Auscultation:  Regular rate, without rubs, murmurs or gallops  Edema/varicosities:  Not grossly evident Abdominal  Soft,nontender, without masses, guarding or rebound.  Liver/spleen:  No organomegaly noted  Hernia:  None appreciated  Skin  Inspection:  Grossly normal  Palpation:  Grossly normal Neurologic/psychiatric  Orientation:  Normal with appropriate conversation.  Mood/affect:  Normal  Genitourinary    Breasts: Examined lying and sitting.     Right: Without masses, retractions, discharge or axillary adenopathy.     Left: Without masses, retractions, discharge or axillary adenopathy.   Inguinal/mons:  Normal without inguinal adenopathy  External genitalia:  Normal  BUS/Urethra/Skene's glands:  Normal  Bladder:  Normal  Vagina:  Atrophic   Cervix:  Normal  Uterus:  Enlarged, 8 weeks' size/fibroids,  Midline and mobile  Adnexa/parametria:     Rt: Without masses or  tenderness.   Lt: Without masses or tenderness.  Anus and perineum: Normal  Digital rectal exam: Normal sphincter tone without palpated masses or tenderness  Assessment/Plan:  56 y.o. M. WF G0 for annual exam with no complaints.  Asymptomatic fibroids and atrophic vaginitis Osteopenia T score -1.8 2012-primary care manages History of IBS and arthritis-primary care  Plan: SBE's, continue annual mammogram, continue exercise, calcium rich diet, vitamin D 2000 daily encouraged, home safety and fall prevention discussed. Encouraged vaginal lubricants with intercourse. Home Hemoccult card given with instructions, no Pap, history of normal Paps new screening guidelines reviewed. Labs are done at primary care.   Harrington Challenger [facility], 4:36 PM 10/24/2011

## 2011-10-25 ENCOUNTER — Encounter: Payer: Self-pay | Admitting: Women's Health

## 2011-11-09 ENCOUNTER — Other Ambulatory Visit (INDEPENDENT_AMBULATORY_CARE_PROVIDER_SITE_OTHER): Payer: 59

## 2011-11-09 DIAGNOSIS — Z0000 Encounter for general adult medical examination without abnormal findings: Secondary | ICD-10-CM

## 2011-11-09 DIAGNOSIS — Z13220 Encounter for screening for lipoid disorders: Secondary | ICD-10-CM

## 2011-11-09 LAB — URINALYSIS, ROUTINE W REFLEX MICROSCOPIC
Bilirubin Urine: NEGATIVE
Ketones, ur: NEGATIVE
Nitrite: NEGATIVE
Total Protein, Urine: NEGATIVE
Urine Glucose: NEGATIVE
pH: 7 (ref 5.0–8.0)

## 2011-11-09 LAB — CBC WITH DIFFERENTIAL/PLATELET
Eosinophils Relative: 1.7 % (ref 0.0–5.0)
Monocytes Relative: 11.8 % (ref 3.0–12.0)
Neutrophils Relative %: 49.5 % (ref 43.0–77.0)
Platelets: 178 10*3/uL (ref 150.0–400.0)
RBC: 4.84 Mil/uL (ref 3.87–5.11)
WBC: 4.8 10*3/uL (ref 4.5–10.5)

## 2011-11-12 LAB — BASIC METABOLIC PANEL
BUN: 23 mg/dL (ref 6–23)
Creatinine, Ser: 0.8 mg/dL (ref 0.4–1.2)
GFR: 76.32 mL/min (ref >60.00)
Potassium: 4.9 mEq/L (ref 3.5–5.1)

## 2011-11-12 LAB — LDL CHOLESTEROL, DIRECT: Direct LDL: 151.4 mg/dL

## 2011-11-12 LAB — HEPATIC FUNCTION PANEL
ALT: 20 U/L (ref 0–35)
AST: 25 U/L (ref 0–37)
Alkaline Phosphatase: 88 U/L (ref 39–117)
Bilirubin, Direct: 0.1 mg/dL (ref 0.0–0.3)
Total Bilirubin: 0.7 mg/dL (ref 0.3–1.2)

## 2011-11-12 LAB — LIPID PANEL
Cholesterol: 214 mg/dL — ABNORMAL HIGH (ref 0–200)
HDL: 85.8 mg/dL (ref >39.00)
Total CHOL/HDL Ratio: 2
VLDL: 9.8 mg/dL (ref 0.0–40.0)

## 2011-11-15 ENCOUNTER — Ambulatory Visit (INDEPENDENT_AMBULATORY_CARE_PROVIDER_SITE_OTHER): Payer: 59 | Admitting: Endocrinology

## 2011-11-15 ENCOUNTER — Encounter: Payer: Self-pay | Admitting: Endocrinology

## 2011-11-15 VITALS — BP 120/72 | HR 70 | Temp 97.1°F | Resp 16 | Ht 67.0 in | Wt 138.0 lb

## 2011-11-15 DIAGNOSIS — Z0000 Encounter for general adult medical examination without abnormal findings: Secondary | ICD-10-CM

## 2011-11-15 DIAGNOSIS — Z23 Encounter for immunization: Secondary | ICD-10-CM

## 2011-11-15 MED ORDER — FLUTICASONE PROPIONATE 50 MCG/ACT NA SUSP: 2.0000 | Freq: Every day | NASAL | Status: DC

## 2011-11-15 MED ORDER — CARISOPRODOL 350 MG PO TABS: ORAL_TABLET | ORAL | Status: DC

## 2011-11-15 MED ORDER — CELECOXIB 200 MG PO CAPS: 200.0000 mg | ORAL_CAPSULE | Freq: Every day | ORAL | Status: DC

## 2011-11-15 MED ORDER — TRAZODONE HCL 50 MG PO TABS: ORAL_TABLET | ORAL | Status: DC

## 2011-11-15 NOTE — Patient Instructions (Addendum)
please consider these measures for your health:  minimize alcohol.  do not use tobacco products.  have a colonoscopy at least every 10 years from age 56.  Women should have an annual mammogram from age 40.  keep firearms safely stored.  always use seat belts.  have working smoke alarms in your home.  see an eye doctor and dentist regularly.  never drive under the influence of alcohol or drugs (including prescription drugs).  those with fair skin should take precautions against the sun.  Please return in 1 year.  

## 2011-11-15 NOTE — Progress Notes (Signed)
Subjective:    Patient ID: Melissa [name], female    DOB: [birth year], 56 y.o.   MRN: [id number]  HPI here for regular wellness examination.  she's feeling pretty well in general, and says chronic med probs are stable, except as noted below Past Medical History  Diagnosis Date  . Fibroid aug. 2010  . Infertility   . IBS (irritable bowel syndrome)   . Scoliosis   . Allergic rhinitis   . Arthritis     Past Surgical History  Procedure Date  . Harrington rods     AGE 84  . Hand surgery 2009    LEFT  . Right hand surgery 2013    History   Social History  . Marital Status: Married    Spouse Name: N/A    Number of Children: N/A  . Years of Education: N/A   Occupational History  . Not on file.   Social History Main Topics  . Smoking status: Never Smoker   . Smokeless tobacco: Never Used  . Alcohol Use: Yes     sometimes  . Drug Use: No  . Sexually Active: Yes -- Female partner(s)    Birth Control/ Protection: Post-menopausal   Other Topics Concern  . Not on file   Social History Narrative   Pt has about 3 caffeine drinks daily ( tea)    Current Outpatient Prescriptions on File Prior to Visit  Medication Sig Dispense Refill  . Ascorbic Acid (VITAMIN C) 1000 MG tablet Take 1,000 mg by mouth daily.        . Azelastine HCl (ASTEPRO) 0.15 % SOLN Place into the nose.        . Calcium Carbonate-Vit D-Min (CALCIUM 1200) 1200-1000 MG-UNIT CHEW Chew 1 tablet by mouth daily.        . cetirizine (ZYRTEC) 10 MG tablet Take 10 mg by mouth daily.        . cholecalciferol (VITAMIN D) 400 UNITS TABS Take 400 Units by mouth daily.        . Glucosamine-Chondroitin (GLUCOSAMINE CHONDR COMPLEX PO) Take 1 tablet by mouth daily.       . Multiple Vitamin (MULTIVITAMIN) tablet Take 1 tablet by mouth daily.        . Omega-3 Fatty Acids (FISH OIL PO) Take by mouth.        . traZODone (DESYREL) 50 MG tablet 1/4 tablet by mouth at bedtime as needed for sleep  30 tablet  11  . VITAMIN E PO  Take by mouth.        . fluticasone (FLONASE) 50 MCG/ACT nasal spray Place 2 sprays into the nose daily.  16 g  11    Allergies  Allergen Reactions  . Ampicillin     REACTION: Rash    Family History  Problem Relation Age of Onset  . Parkinsonism Sister     BP 120/72  Pulse 70  Temp 97.1 F (36.2 C) (Oral)  Resp 16  Ht 5\' 7"  (1.702 m)  Wt 138 lb (62.596 kg)  BMI 21.61 kg/m2  SpO2 99%  LMP 12/18/2003     Review of Systems  Constitutional: Negative for fever and unexpected weight change.  HENT: Negative for hearing loss.   Eyes: Negative for visual disturbance.  Respiratory: Negative for shortness of breath.   Cardiovascular: Negative for chest pain.  Gastrointestinal: Negative for anal bleeding.  Genitourinary: Negative for hematuria.  Musculoskeletal: Positive for back pain.  Skin: Negative for rash.  Neurological: Negative for syncope and  numbness.  Hematological: Does not bruise/bleed easily.  Psychiatric/Behavioral: Negative for dysphoric mood.       Objective:   Physical Exam VS: see vs page GEN: no distress HEAD: head: no deformity eyes: no periorbital swelling, no proptosis external nose and ears are normal mouth: no lesion seen NECK: supple, thyroid is not enlarged CHEST WALL: no deformity LUNGS:  Clear to auscultation BREASTS:  sees gyn CV: reg rate and rhythm, no murmur ABD: abdomen is soft, nontender.  no hepatosplenomegaly.  not distended.  no hernia GENITALIA/RECTAL: sees gyn MUSCULOSKELETAL: muscle bulk and strength are grossly normal.  no obvious joint swelling.  gait is normal and steady EXTEMITIES: no deformity.  no ulcer on the feet.  feet are of normal color and temp.  no edema PULSES: dorsalis pedis intact bilat.  no carotid bruit NEURO:  cn 2-12 grossly intact.   readily moves all 4's.  sensation is intact to touch on the feet SKIN:  Normal texture and temperature.  No rash or suspicious lesion is visible.   NODES:  None palpable at  the neck PSYCH: alert, oriented x3.  Does not appear anxious nor depressed.        Assessment & Plan:  Wellness visit today, with problems stable, except as noted.

## 2011-11-20 ENCOUNTER — Telehealth: Payer: Self-pay | Admitting: Endocrinology

## 2011-11-20 MED ORDER — CELECOXIB 200 MG PO CAPS: 200.0000 mg | ORAL_CAPSULE | Freq: Two times a day (BID) | ORAL | Status: AC

## 2011-11-20 NOTE — Telephone Encounter (Signed)
Please advise 

## 2011-11-20 NOTE — Telephone Encounter (Signed)
i sent rx 

## 2011-11-20 NOTE — Telephone Encounter (Signed)
Pharmacy is calling to speak with someone to clarify the directions for the Celebrex and trazadone, patient is telling pharmacy that the directions are not correct

## 2011-11-20 NOTE — Telephone Encounter (Signed)
Pt wants rx for Celebrex to say 1 bid so that she can get 60 tablets (per rx is written now, she can only get 30) and wants rx for Trazodone to say 1 tablet prn (pt can only get 7 per current rx, pt would like to get 30 tablets).

## 2011-11-20 NOTE — Telephone Encounter (Signed)
What does pt say dosage should be?

## 2012-03-18 ENCOUNTER — Other Ambulatory Visit: Payer: Self-pay

## 2012-03-24 ENCOUNTER — Telehealth: Payer: Self-pay | Admitting: Endocrinology

## 2012-03-24 NOTE — Telephone Encounter (Signed)
Patient left message stating that she needs rx for Celebrex.  [facility] sent electronic request.  She said that we need to contact [facility] in order to complete prescription.

## 2012-03-24 NOTE — Telephone Encounter (Signed)
Left message for patient [facility] closed for holiday, will call back

## 2012-03-26 DIAGNOSIS — Z0279 Encounter for issue of other medical certificate: Secondary | ICD-10-CM

## 2012-03-27 ENCOUNTER — Telehealth: Payer: Self-pay | Admitting: *Deleted

## 2012-03-27 NOTE — Telephone Encounter (Signed)
Prior authorization form faxed to [facility] pharmacy for celebrex approved.

## 2012-03-27 NOTE — Telephone Encounter (Signed)
Pt advised we are waiting to receive from from [facility] to get [place]

## 2012-03-27 NOTE — Telephone Encounter (Signed)
Pt left another message regarding status of PA for Celebrex.

## 2012-03-31 NOTE — Telephone Encounter (Signed)
Pt advised medication approved

## 2012-06-02 ENCOUNTER — Other Ambulatory Visit: Payer: Self-pay

## 2012-06-02 DIAGNOSIS — Z1231 Encounter for screening mammogram for malignant neoplasm of breast: Secondary | ICD-10-CM

## 2012-07-04 ENCOUNTER — Ambulatory Visit: Payer: 59

## 2012-07-15 ENCOUNTER — Ambulatory Visit
Admission: RE | Admit: 2012-07-15 | Discharge: 2012-07-15 | Disposition: A | Discharge status: Home / Self Care | Payer: BC Managed Care – PPO | Source: Ambulatory Visit

## 2012-07-15 DIAGNOSIS — Z1231 Encounter for screening mammogram for malignant neoplasm of breast: Secondary | ICD-10-CM

## 2012-08-08 ENCOUNTER — Other Ambulatory Visit: Payer: Self-pay

## 2012-08-08 MED ORDER — CARISOPRODOL 350 MG PO TABS: ORAL_TABLET | ORAL | Status: DC

## 2012-11-05 ENCOUNTER — Ambulatory Visit (INDEPENDENT_AMBULATORY_CARE_PROVIDER_SITE_OTHER): Payer: BC Managed Care – PPO | Admitting: Endocrinology

## 2012-11-05 ENCOUNTER — Encounter: Payer: Self-pay | Admitting: Endocrinology

## 2012-11-05 VITALS — BP 122/80 | Ht 66.0 in | Wt 143.0 lb

## 2012-11-05 DIAGNOSIS — M810 Age-related osteoporosis without current pathological fracture: Secondary | ICD-10-CM

## 2012-11-05 DIAGNOSIS — Z0000 Encounter for general adult medical examination without abnormal findings: Secondary | ICD-10-CM

## 2012-11-05 DIAGNOSIS — D72819 Decreased white blood cell count, unspecified: Secondary | ICD-10-CM

## 2012-11-05 LAB — CBC WITH DIFFERENTIAL/PLATELET
Basophils Relative: 0.6 % (ref 0.0–3.0)
Eosinophils Absolute: 0.1 10*3/uL (ref 0.0–0.7)
Hemoglobin: 14.5 g/dL (ref 12.0–15.0)
MCHC: 33.5 g/dL (ref 30.0–36.0)
MCV: 91.8 fl (ref 78.0–100.0)
Monocytes Absolute: 0.6 10*3/uL (ref 0.1–1.0)
Neutro Abs: 2.9 10*3/uL (ref 1.4–7.7)
Neutrophils Relative %: 55.6 % (ref 43.0–77.0)
RBC: 4.7 Mil/uL (ref 3.87–5.11)

## 2012-11-05 LAB — BASIC METABOLIC PANEL
Chloride: 103 mEq/L (ref 96–112)
Creatinine, Ser: 0.7 mg/dL (ref 0.4–1.2)
Sodium: 138 mEq/L (ref 135–145)

## 2012-11-05 LAB — HEPATIC FUNCTION PANEL
ALT: 19 U/L (ref 0–35)
Bilirubin, Direct: 0 mg/dL (ref 0.0–0.3)
Total Bilirubin: 0.8 mg/dL (ref 0.3–1.2)

## 2012-11-05 LAB — LIPID PANEL
HDL: 72.6 mg/dL (ref >39.00)
LDL Cholesterol: 102 mg/dL — ABNORMAL HIGH (ref 0–99)
Total CHOL/HDL Ratio: 3
Triglycerides: 75 mg/dL (ref 0.0–149.0)

## 2012-11-05 LAB — URINALYSIS, ROUTINE W REFLEX MICROSCOPIC
Bilirubin Urine: NEGATIVE
Hgb urine dipstick: NEGATIVE
Ketones, ur: NEGATIVE
Urine Glucose: NEGATIVE
Urobilinogen, UA: 0.2 (ref 0.0–1.0)

## 2012-11-05 MED ORDER — ZOLPIDEM TARTRATE 5 MG PO TABS: 5.0000 mg | ORAL_TABLET | Freq: Every evening | ORAL | Status: DC | PRN

## 2012-11-05 NOTE — Progress Notes (Signed)
Subjective:    Patient ID: Melissa [name], female    DOB: 02/01/1956, 57 y.o.   MRN: [id number]  HPI Pt is here for regular wellness examination, and is feeling pretty well in general, and says chronic med probs are stable, except as noted below. Past Medical History  Diagnosis Date  . Fibroid aug. 2010  . Infertility   . IBS (irritable bowel syndrome)   . Scoliosis   . Allergic rhinitis   . Arthritis     Past Surgical History  Procedure Laterality Date  . Harrington rods      AGE 45  . Hand surgery  2009    LEFT  . Right hand surgery  2013    History   Social History  . Marital Status: Married    Spouse Name: N/A    Number of Children: N/A  . Years of Education: N/A   Occupational History  . Not on file.   Social History Main Topics  . Smoking status: Never Smoker   . Smokeless tobacco: Never Used  . Alcohol Use: Yes     Comment: sometimes  . Drug Use: No  . Sexual Activity: Yes    Partners: Male    Birth Control/ Protection: Post-menopausal   Other Topics Concern  . Not on file   Social History Narrative   Pt has about 3 caffeine drinks daily ( tea)    Current Outpatient Prescriptions on File Prior to Visit  Medication Sig Dispense Refill  . Ascorbic Acid (VITAMIN C) 1000 MG tablet Take 1,000 mg by mouth daily.        . Azelastine HCl (ASTEPRO) 0.15 % SOLN Place into the nose.        . Calcium Carbonate-Vit D-Min (CALCIUM 1200) 1200-1000 MG-UNIT CHEW Chew 1 tablet by mouth daily.        . carisoprodol (SOMA) 350 MG tablet 1 tablet by mouth three times daily as needed for pain  50 tablet  1  . cetirizine (ZYRTEC) 10 MG tablet Take 10 mg by mouth daily.        . cholecalciferol (VITAMIN D) 400 UNITS TABS Take 400 Units by mouth daily.        . fluticasone (FLONASE) 50 MCG/ACT nasal spray Place 2 sprays into the nose daily.  16 g  11  . Glucosamine-Chondroitin (GLUCOSAMINE CHONDR COMPLEX PO) Take 1 tablet by mouth daily.       . Multiple Vitamin  (MULTIVITAMIN) tablet Take 1 tablet by mouth daily.        . Omega-3 Fatty Acids (FISH OIL PO) Take by mouth.        . traZODone (DESYREL) 50 MG tablet 1/4 tablet by mouth at bedtime as needed for sleep  30 tablet  11  . VITAMIN E PO Take by mouth.         No current facility-administered medications on file prior to visit.    Allergies  Allergen Reactions  . Ampicillin     REACTION: Rash    Family History  Problem Relation Age of Onset  . Parkinsonism Sister     BP 122/80  Ht 5\' 6"  (1.676 m)  Wt 143 lb (64.864 kg)  BMI 23.09 kg/m2  SpO2 98%  LMP 12/18/2003    Review of Systems  Constitutional: Negative for fever and unexpected weight change.  HENT: Negative for hearing loss.   Eyes: Negative for visual disturbance.  Respiratory: Negative for shortness of breath.   Cardiovascular: Negative for chest  pain.  Gastrointestinal: Negative for anal bleeding.  Endocrine: Negative for cold intolerance.  Genitourinary: Negative for hematuria.  Musculoskeletal: Positive for back pain.  Skin: Negative for rash.  Allergic/Immunologic: Positive for environmental allergies.  Neurological: Negative for syncope and numbness.  Hematological: Does not bruise/bleed easily.  Psychiatric/Behavioral: Negative for dysphoric mood.       Objective:   Physical Exam VS: see vs page GEN: no distress HEAD: head: no deformity eyes: no periorbital swelling, no proptosis external nose and ears are normal mouth: no lesion seen NECK: supple, thyroid is not enlarged CHEST WALL: no deformity LUNGS:  Clear to auscultation BREASTS:  sees gyn.   CV: reg rate and rhythm, no murmur ABD: abdomen is soft, nontender.  no hepatosplenomegaly.  not distended.  no hernia GENITALIA/RECTAL: sees gyn MUSCULOSKELETAL: muscle bulk and strength are grossly normal.  no obvious joint swelling.  gait is normal and steady EXTEMITIES: no deformity.  no ulcer on the feet.  feet are of normal color and temp.  no  edema PULSES: dorsalis pedis intact bilat.  no carotid bruit NEURO:  cn 2-12 grossly intact.   readily moves all 4's.  sensation is intact to touch on the feet SKIN:  Normal texture and temperature.  No rash or suspicious lesion is visible.   NODES:  None palpable at the neck PSYCH: alert, oriented x3.  Does not appear anxious nor depressed.   i reviewed electrocardiogram     Assessment & Plan:  Wellness visit today, with problems stable, except as noted. we discussed code status.  pt requests full code, but would not want to be started or maintained on artificial life-support measures if there was not a reasonable chance of recovery

## 2012-11-05 NOTE — Patient Instructions (Addendum)
please consider these measures for your health:  minimize alcohol.  do not use tobacco products.  have a colonoscopy at least every 10 years from age 57.  Women should have an annual mammogram from age 29.  keep firearms safely stored.  always use seat belts.  have working smoke alarms in your home.  see an eye doctor and dentist regularly.  never drive under the influence of alcohol or drugs (including prescription drugs).  those with fair skin should take precautions against the sun. blood tests are being requested for you today.  We'll contact you with results. Please return in 1 year.  Here is a prescription, to help you sleep at night.

## 2012-11-18 ENCOUNTER — Other Ambulatory Visit: Payer: Self-pay | Admitting: *Deleted

## 2012-11-18 ENCOUNTER — Other Ambulatory Visit: Payer: Self-pay | Admitting: Endocrinology

## 2012-11-18 ENCOUNTER — Other Ambulatory Visit: Payer: Self-pay

## 2012-11-18 MED ORDER — CARISOPRODOL 350 MG PO TABS: ORAL_TABLET | ORAL | Status: DC

## 2012-11-18 MED ORDER — FLUTICASONE PROPIONATE 50 MCG/ACT NA SUSP: 2.0000 | Freq: Every day | NASAL | Status: DC

## 2012-12-22 ENCOUNTER — Other Ambulatory Visit: Payer: Self-pay

## 2012-12-22 MED ORDER — CELECOXIB 200 MG PO CAPS: 200.0000 mg | ORAL_CAPSULE | Freq: Two times a day (BID) | ORAL | Status: DC

## 2012-12-22 NOTE — Telephone Encounter (Signed)
Refilled celebrex to [facility] pharmacy...ds,cma

## 2013-02-12 ENCOUNTER — Ambulatory Visit (INDEPENDENT_AMBULATORY_CARE_PROVIDER_SITE_OTHER): Payer: BC Managed Care – PPO | Admitting: Women's Health

## 2013-02-12 ENCOUNTER — Encounter: Payer: Self-pay | Admitting: Women's Health

## 2013-02-12 ENCOUNTER — Other Ambulatory Visit (HOSPITAL_COMMUNITY)
Admission: RE | Admit: 2013-02-12 | Discharge: 2013-02-12 | Disposition: A | Discharge status: Home / Self Care | Payer: BC Managed Care – PPO | Source: Ambulatory Visit | Attending: Gynecology | Admitting: Gynecology

## 2013-02-12 VITALS — BP 116/64 | Ht 66.5 in | Wt 144.4 lb

## 2013-02-12 DIAGNOSIS — Z01419 Encounter for gynecological examination (general) (routine) without abnormal findings: Secondary | ICD-10-CM

## 2013-02-12 NOTE — Progress Notes (Signed)
Melissa [name] April 17, 1955 [id number]    History:    The patient presents for annual exam.  Postmenopausal/no HRT/no bleeding. Stopped HRT 2012. Normal Pap and mammogram history. 2012 DEXA T score -1.8 done at [facility].  Negative colonoscopy 2009. Small fibroid.   Past medical history, past surgical history, family history and social history were all reviewed and documented in the EPIC chart. Desk job. 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 Arthritis, will need a knee replacement in the future.   ROS:  A  ROS was performed and pertinent positives and negatives are included in the history.  Exam:  Filed Vitals:   02/12/13 1409  BP: 116/64    General appearance:  Normal Head/Neck:  Normal, without cervical or supraclavicular adenopathy. Thyroid:  Symmetrical, normal in size, without palpable masses or nodularity. Respiratory  Effort:  Normal  Auscultation:  Clear without wheezing or rhonchi Cardiovascular  Auscultation:  Regular rate, without rubs, murmurs or gallops  Edema/varicosities:  Not grossly evident Abdominal  Soft,nontender, without masses, guarding or rebound.  Liver/spleen:  No organomegaly noted  Hernia:  None appreciated  Skin  Inspection:  Grossly normal  Palpation:  Grossly normal Neurologic/psychiatric  Orientation:  Normal with appropriate conversation.  Mood/affect:  Normal  Genitourinary    Breasts: Examined lying and sitting.     Right: Without masses, retractions, discharge or axillary adenopathy.     Left: Without masses, retractions, discharge or axillary adenopathy.   Inguinal/mons:  Normal without inguinal adenopathy  External genitalia:  Normal  BUS/Urethra/Skene's glands:  Normal  Bladder:  Normal  Vagina:  Normal  Cervix:  Normal  Uterus:   normal in size, shape and contour.  Midline and mobile  Adnexa/parametria:     Rt: Without masses or tenderness.   Lt: Without masses or tenderness.  Anus and perineum: Normal  Digital rectal  exam: Normal sphincter tone without palpated masses or tenderness  Assessment/Plan:  57 y.o. MWF G0 for annual exam with no complaints.  Postmenopausal/no HRT/no bleeding Osteopenia -  DEXA and labs managed by primary care Arthritis  Plan: SBE's, annual mammogram, reviewed 3-D tomography and encouraged history of dense breasts. Exercise, calcium rich diet, vitamin D 2000 daily encouraged. Home safety and fall prevention discussed. Pap. Pap normal 2012, new screening guidelines reviewed.    Harrington Challenger WHNP, 2:42 PM 02/12/2013

## 2013-02-12 NOTE — Addendum Note (Signed)
Addended by: Richardson Chiquito on: 02/12/2013 05:07 PM   Modules accepted: Orders

## 2013-02-12 NOTE — Patient Instructions (Signed)
Health Recommendations for Postmenopausal Women Respected and ongoing research has looked at the most common causes of death, disability, and poor quality of life in postmenopausal women. The causes include heart disease, diseases of blood vessels, diabetes, depression, cancer, and bone loss (osteoporosis). Many things can be done to help lower the chances of developing these and other common problems: CARDIOVASCULAR DISEASE Heart Disease: A heart attack is a medical emergency. Know the signs and symptoms of a heart attack. Below are things women can do to reduce their risk for heart disease.   Do not smoke. If you smoke, quit.  Aim for a healthy weight. Being overweight causes many preventable deaths. Eat a healthy and balanced diet and drink an adequate amount of liquids.  Get moving. Make a commitment to be more physically active. Aim for 30 minutes of activity on most, if not all days of the week.  Eat for heart health. Choose a diet that is low in saturated fat and cholesterol and eliminate trans fat. Include whole grains, vegetables, and fruits. Read and understand the labels on food containers before buying.  Know your numbers. Ask your caregiver to check your blood pressure, cholesterol (total, HDL, LDL, triglycerides) and blood glucose. Work with your caregiver on improving your entire clinical picture.  High blood pressure. Limit or stop your table salt intake (try salt substitute and food seasonings). Avoid salty foods and drinks. Read labels on food containers before buying. Eating well and exercising can help control high blood pressure. STROKE  Stroke is a medical emergency. Stroke may be the result of a blood clot in a blood vessel in the brain or by a brain hemorrhage (bleeding). Know the signs and symptoms of a stroke. To lower the risk of developing a stroke:  Avoid fatty foods.  Quit smoking.  Control your diabetes, blood pressure, and irregular heart rate. THROMBOPHLEBITIS  (BLOOD CLOT) OF THE LEG  Becoming overweight and leading a stationary lifestyle may also contribute to developing blood clots. Controlling your diet and exercising will help lower the risk of developing blood clots. CANCER SCREENING  Breast Cancer: Take steps to reduce your risk of breast cancer.  You should practice "breast self-awareness." This means understanding the normal appearance and feel of your breasts and should include breast self-examination. Any changes detected, no matter how small, should be reported to your caregiver.  After age 40, you should have a clinical breast exam (CBE) every year.  Starting at age 40, you should consider having a mammogram (breast X-ray) every year.  If you have a family history of breast cancer, talk to your caregiver about genetic screening.  If you are at high risk for breast cancer, talk to your caregiver about having an MRI and a mammogram every year.  Intestinal or Stomach Cancer: Tests to consider are a rectal exam, fecal occult blood, sigmoidoscopy, and colonoscopy. Women who are high risk may need to be screened at an earlier age and more often.  Cervical Cancer:  Beginning at age 30, you should have a Pap test every 3 years as long as the past 3 Pap tests have been normal.  If you have had past treatment for cervical cancer or a condition that could lead to cancer, you need Pap tests and screening for cancer for at least 20 years after your treatment.  If you had a hysterectomy for a problem that was not cancer or a condition that could lead to cancer, then you no longer need Pap tests.    If you are between ages 65 and 70, and you have had normal Pap tests going back 10 years, you no longer need Pap tests.  If Pap tests have been discontinued, risk factors (such as a new sexual partner) need to be reassessed to determine if screening should be resumed.  Some medical problems can increase the chance of getting cervical cancer. In these  cases, your caregiver may recommend more frequent screening and Pap tests.  Uterine Cancer: If you have vaginal bleeding after reaching menopause, you should notify your caregiver.  Ovarian cancer: Other than yearly pelvic exams, there are no reliable tests available to screen for ovarian cancer at this time except for yearly pelvic exams.  Lung Cancer: Yearly chest X-rays can detect lung cancer and should be done on high risk women, such as cigarette smokers and women with chronic lung disease (emphysema).  Skin Cancer: A complete body skin exam should be done at your yearly examination. Avoid overexposure to the sun and ultraviolet light lamps. Use a strong sun block cream when in the sun. All of these things are important in lowering the risk of skin cancer. MENOPAUSE Menopause Symptoms: Hormone therapy products are effective for treating symptoms associated with menopause:  Moderate to severe hot flashes.  Night sweats.  Mood swings.  Headaches.  Tiredness.  Loss of sex drive.  Insomnia.  Other symptoms. Hormone replacement carries certain risks, especially in older women. Women who use or are thinking about using estrogen or estrogen with progestin treatments should discuss that with their caregiver. Your caregiver will help you understand the benefits and risks. The ideal dose of hormone replacement therapy is not known. The Food and Drug Administration (FDA) has concluded that hormone therapy should be used only at the lowest doses and for the shortest amount of time to reach treatment goals.  OSTEOPOROSIS Protecting Against Bone Loss and Preventing Fracture: If you use hormone therapy for prevention of bone loss (osteoporosis), the risks for bone loss must outweigh the risk of the therapy. Ask your caregiver about other medications known to be safe and effective for preventing bone loss and fractures. To guard against bone loss or fractures, the following is recommended:  If  you are less than age 50, take 1000 mg of calcium and at least 600 mg of Vitamin D per day.  If you are greater than age 50 but less than age 70, take 1200 mg of calcium and at least 600 mg of Vitamin D per day.  If you are greater than age 70, take 1200 mg of calcium and at least 800 mg of Vitamin D per day. Smoking and excessive alcohol intake increases the risk of osteoporosis. Eat foods rich in calcium and vitamin D and do weight bearing exercises several times a week as your caregiver suggests. DIABETES Diabetes Melitus: If you have Type I or Type 2 diabetes, you should keep your blood sugar under control with diet, exercise and recommended medication. Avoid too many sweets, starchy and fatty foods. Being overweight can make control more difficult. COGNITION AND MEMORY Cognition and Memory: Menopausal hormone therapy is not recommended for the prevention of cognitive disorders such as Alzheimer's disease or memory loss.  DEPRESSION  Depression may occur at any age, but is common in elderly women. The reasons may be because of physical, medical, social (loneliness), or financial problems and needs. If you are experiencing depression because of medical problems and control of symptoms, talk to your caregiver about this. Physical activity and   exercise may help with mood and sleep. Community and volunteer involvement may help your sense of value and worth. If you have depression and you feel that the problem is getting worse or becoming severe, talk to your caregiver about treatment options that are best for you. ACCIDENTS  Accidents are common and can be serious in the elderly woman. Prepare your house to prevent accidents. Eliminate throw rugs, place hand bars in the bath, shower and toilet areas. Avoid wearing high heeled shoes or walking on wet, snowy, and icy areas. Limit or stop driving if you have vision or hearing problems, or you feel you are unsteady with you movements and  reflexes. HEPATITIS C Hepatitis C is a type of viral infection affecting the liver. It is spread mainly through contact with blood from an infected person. It can be treated, but if left untreated, it can lead to severe liver damage over years. Many people who are infected do not know that the virus is in their blood. If you are a "baby-boomer", it is recommended that you have one screening test for Hepatitis C. IMMUNIZATIONS  Several immunizations are important to consider having during your senior years, including:   Tetanus, diptheria, and pertussis booster shot.  Influenza every year before the flu season begins.  Pneumonia vaccine.  Shingles vaccine.  Others as indicated based on your specific needs. Talk to your caregiver about these. Document Released: 04/13/2005 Document Revised: 02/06/2012 Document Reviewed: 12/08/2007 [employer] Patient Information 2014 [employer].  

## 2013-06-09 ENCOUNTER — Telehealth: Payer: Self-pay | Admitting: *Deleted

## 2013-06-09 MED ORDER — VALACYCLOVIR HCL 500 MG PO TABS: ORAL_TABLET | ORAL | Status: DC

## 2013-06-09 NOTE — Telephone Encounter (Signed)
Pt informed, rx sent 

## 2013-06-09 NOTE — Telephone Encounter (Signed)
Please call in Valtrex 500 twice daily for 3-5 days #30 with 1 refill.

## 2013-06-09 NOTE — Telephone Encounter (Signed)
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 Please advise

## 2013-06-17 ENCOUNTER — Encounter: Payer: Self-pay | Admitting: Endocrinology

## 2013-06-17 ENCOUNTER — Other Ambulatory Visit: Payer: Self-pay

## 2013-06-17 ENCOUNTER — Ambulatory Visit (INDEPENDENT_AMBULATORY_CARE_PROVIDER_SITE_OTHER): Payer: BC Managed Care – PPO | Admitting: Endocrinology

## 2013-06-17 VITALS — BP 116/80 | HR 75 | Temp 97.9°F | Ht 66.5 in | Wt 144.0 lb

## 2013-06-17 DIAGNOSIS — F411 Generalized anxiety disorder: Secondary | ICD-10-CM | POA: Insufficient documentation

## 2013-06-17 DIAGNOSIS — Z1231 Encounter for screening mammogram for malignant neoplasm of breast: Secondary | ICD-10-CM

## 2013-06-17 MED ORDER — VENLAFAXINE HCL ER 75 MG PO CP24: ORAL_CAPSULE | ORAL | Status: DC

## 2013-06-17 MED ORDER — ALPRAZOLAM 0.25 MG PO TABS: 0.2500 mg | ORAL_TABLET | Freq: Three times a day (TID) | ORAL | Status: DC | PRN
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
## 2013-06-17 NOTE — Patient Instructions (Signed)
i have sent a prescription to your pharmacy, to take daily for your symptoms.  This takes time to work. Also, here is a prescription for an as-needed medication.  This can cause drowsiness, and can be habit-forming, so this is only a short-term option, while the other is working.   Refer to a specialist.  you will receive a phone call, about a day and time for an appointment.

## 2013-06-25 ENCOUNTER — Telehealth (HOSPITAL_COMMUNITY): Payer: Self-pay

## 2013-07-21 ENCOUNTER — Ambulatory Visit
Admission: RE | Admit: 2013-07-21 | Discharge: 2013-07-21 | Disposition: A | Discharge status: Home / Self Care | Payer: BC Managed Care – PPO | Source: Ambulatory Visit

## 2013-07-21 DIAGNOSIS — Z1231 Encounter for screening mammogram for malignant neoplasm of breast: Secondary | ICD-10-CM

## 2013-07-23 ENCOUNTER — Other Ambulatory Visit: Payer: Self-pay | Admitting: Gynecology

## 2013-07-23 DIAGNOSIS — R928 Other abnormal and inconclusive findings on diagnostic imaging of breast: Secondary | ICD-10-CM

## 2013-07-31 ENCOUNTER — Other Ambulatory Visit: Payer: Self-pay

## 2013-07-31 ENCOUNTER — Other Ambulatory Visit: Payer: Self-pay | Admitting: Gynecology

## 2013-07-31 DIAGNOSIS — R928 Other abnormal and inconclusive findings on diagnostic imaging of breast: Secondary | ICD-10-CM

## 2013-08-05 ENCOUNTER — Ambulatory Visit
Admission: RE | Admit: 2013-08-05 | Discharge: 2013-08-05 | Disposition: A | Discharge status: Home / Self Care | Payer: BC Managed Care – PPO | Source: Ambulatory Visit | Attending: Gynecology | Admitting: Gynecology

## 2013-08-05 DIAGNOSIS — R928 Other abnormal and inconclusive findings on diagnostic imaging of breast: Secondary | ICD-10-CM

## 2013-09-29 ENCOUNTER — Telehealth: Payer: Self-pay

## 2013-09-29 MED ORDER — ZOLPIDEM TARTRATE 5 MG PO TABS: 5.0000 mg | ORAL_TABLET | Freq: Every evening | ORAL | Status: DC | PRN

## 2013-09-29 NOTE — Telephone Encounter (Signed)
Received a refill request for Ambien. Pt was last seen by Dr. Loanne Drilling on 06/17/2013 and med was last filled on 11/05/2012.  Please advise if ok to refill during Dr. Cordelia Pen absence. Thanks!

## 2013-09-29 NOTE — Telephone Encounter (Signed)
Rx faxed to pharmacy  

## 2013-09-29 NOTE — Telephone Encounter (Signed)
Ok to refill 30 with no refills for now.

## 2013-11-03 ENCOUNTER — Other Ambulatory Visit: Payer: Self-pay | Admitting: Endocrinology

## 2013-11-04 NOTE — Telephone Encounter (Signed)
Please refill x 1 cpx is due 

## 2013-12-14 ENCOUNTER — Other Ambulatory Visit: Payer: Self-pay

## 2013-12-14 MED ORDER — FLUTICASONE PROPIONATE 50 MCG/ACT NA SUSP: 2.0000 | Freq: Every day | NASAL | Status: AC

## 2013-12-18 ENCOUNTER — Other Ambulatory Visit: Payer: Self-pay

## 2014-01-14 ENCOUNTER — Other Ambulatory Visit: Payer: Self-pay | Admitting: Gynecology

## 2014-01-14 DIAGNOSIS — N63 Unspecified lump in unspecified breast: Secondary | ICD-10-CM

## 2014-01-19 ENCOUNTER — Encounter: Payer: BC Managed Care – PPO | Admitting: Endocrinology

## 2014-01-20 ENCOUNTER — Encounter: Payer: Self-pay | Admitting: Endocrinology

## 2014-01-20 ENCOUNTER — Ambulatory Visit (INDEPENDENT_AMBULATORY_CARE_PROVIDER_SITE_OTHER): Payer: BC Managed Care – PPO | Admitting: Endocrinology

## 2014-01-20 VITALS — BP 120/78 | HR 77 | Temp 97.8°F | Wt 145.0 lb

## 2014-01-20 DIAGNOSIS — D72819 Decreased white blood cell count, unspecified: Secondary | ICD-10-CM

## 2014-01-20 DIAGNOSIS — M810 Age-related osteoporosis without current pathological fracture: Secondary | ICD-10-CM

## 2014-01-20 DIAGNOSIS — Z23 Encounter for immunization: Secondary | ICD-10-CM

## 2014-01-20 DIAGNOSIS — Z0000 Encounter for general adult medical examination without abnormal findings: Secondary | ICD-10-CM

## 2014-01-20 LAB — CBC WITH DIFFERENTIAL/PLATELET
BASOS PCT: 0 % (ref 0–1)
Basophils Absolute: 0 10*3/uL (ref 0.0–0.1)
EOS ABS: 0.1 10*3/uL (ref 0.0–0.7)
EOS PCT: 1 % (ref 0–5)
HCT: 44.1 % (ref 36.0–46.0)
Hemoglobin: 14.7 g/dL (ref 12.0–15.0)
Lymphocytes Relative: 26 % (ref 12–46)
Lymphs Abs: 1.4 10*3/uL (ref 0.7–4.0)
MCH: 30.7 pg (ref 26.0–34.0)
MCHC: 33.3 g/dL (ref 30.0–36.0)
MCV: 92.1 fL (ref 78.0–100.0)
MONOS PCT: 10 % (ref 3–12)
MPV: 11.1 fL (ref 9.4–12.4)
Monocytes Absolute: 0.6 10*3/uL (ref 0.1–1.0)
NEUTROS PCT: 63 % (ref 43–77)
Neutro Abs: 3.5 10*3/uL (ref 1.7–7.7)
Platelets: 244 10*3/uL (ref 150–400)
RBC: 4.79 MIL/uL (ref 3.87–5.11)
RDW: 13.4 % (ref 11.5–15.5)
WBC: 5.5 10*3/uL (ref 4.0–10.5)

## 2014-01-20 LAB — LIPID PANEL
CHOL/HDL RATIO: 2.7 ratio
Cholesterol: 183 mg/dL (ref 0–200)
HDL: 68 mg/dL (ref >39)
LDL Cholesterol: 105 mg/dL — ABNORMAL HIGH (ref 0–99)
Triglycerides: 51 mg/dL (ref <150)
VLDL: 10 mg/dL (ref 0–40)

## 2014-01-20 LAB — BASIC METABOLIC PANEL
BUN: 16 mg/dL (ref 6–23)
CO2: 26 meq/L (ref 19–32)
Calcium: 9.4 mg/dL (ref 8.4–10.5)
Chloride: 103 mEq/L (ref 96–112)
Creat: 0.8 mg/dL (ref 0.50–1.10)
GLUCOSE: 71 mg/dL (ref 70–99)
Potassium: 4.9 mEq/L (ref 3.5–5.3)
SODIUM: 142 meq/L (ref 135–145)

## 2014-01-20 LAB — TSH: TSH: 1.298 u[IU]/mL (ref 0.350–4.500)

## 2014-01-20 LAB — HEPATIC FUNCTION PANEL
ALK PHOS: 91 U/L (ref 39–117)
ALT: 20 U/L (ref 0–35)
AST: 27 U/L (ref 0–37)
Albumin: 4.3 g/dL (ref 3.5–5.2)
BILIRUBIN INDIRECT: 0.4 mg/dL (ref 0.2–1.2)
Bilirubin, Direct: 0.1 mg/dL (ref 0.0–0.3)
TOTAL PROTEIN: 6.6 g/dL (ref 6.0–8.3)
Total Bilirubin: 0.5 mg/dL (ref 0.2–1.2)

## 2014-01-20 NOTE — Patient Instructions (Signed)
please consider these measures for your health:  minimize alcohol.  do not use tobacco products.  have a colonoscopy at least every 10 years from age 59.  Women should have an annual mammogram from age 38.  keep firearms safely stored.  always use seat belts.  have working smoke alarms in your home.  see an eye doctor and dentist regularly.  never drive under the influence of alcohol or drugs (including prescription drugs).  those with fair skin should take precautions against the sun.  blood tests are being requested for you today.  We'll let you know about the results.  Please return in 1 year.

## 2014-01-20 NOTE — Progress Notes (Signed)
Subjective:    Patient ID: Melissa [name], female    DOB: 01/09/1956, 58 y.o.   MRN: [id number]  HPI Pt is here for regular wellness examination, and is feeling pretty well in general, and says chronic med probs are stable, except as noted below Past Medical History  Diagnosis Date  . Fibroid aug. 2010  . Infertility   . IBS (irritable bowel syndrome)   . Scoliosis   . Allergic rhinitis   . Arthritis     Past Surgical History  Procedure Laterality Date  . Harrington rods      AGE 70  . Hand surgery  2009    LEFT  . Right hand surgery  2013    History   Social History  . Marital Status: Married    Spouse Name: N/A    Number of Children: N/A  . Years of Education: N/A   Occupational History  . Not on file.   Social History Main Topics  . Smoking status: Never Smoker   . Smokeless tobacco: Never Used  . Alcohol Use: Yes     Comment: sometimes  . Drug Use: No  . Sexual Activity:    Partners: Male    Birth Control/ Protection: Post-menopausal   Other Topics Concern  . Not on file   Social History Narrative   Pt has about 3 caffeine drinks daily ( tea)    Current Outpatient Prescriptions on File Prior to Visit  Medication Sig Dispense Refill  . ALPRAZolam (XANAX) 0.25 MG tablet Take 1 tablet (0.25 mg total) by mouth 3 (three) times daily as needed for anxiety. 30 tablet 1  . Ascorbic Acid (VITAMIN C) 1000 MG tablet Take 1,000 mg by mouth daily.      . Azelastine HCl (ASTEPRO) 0.15 % SOLN Place into the nose.      . Calcium Carbonate-Vit D-Min (CALCIUM 1200) 1200-1000 MG-UNIT CHEW Chew 1 tablet by mouth daily.      . carisoprodol (SOMA) 350 MG tablet TAKE 1 TABLET BY MOUTH 3 TIMES A DAY AS NEEDED FOR PAIN 50 tablet 0  . celecoxib (CELEBREX) 200 MG capsule Take 1 capsule (200 mg total) by mouth 2 (two) times daily. 60 capsule 11  . cetirizine (ZYRTEC) 10 MG tablet Take 10 mg by mouth daily.      . cholecalciferol (VITAMIN D) 400 UNITS TABS Take 400 Units by  mouth daily.      . fluticasone (FLONASE) 50 MCG/ACT nasal spray Place 2 sprays into both nostrils daily. 16 g 11  . Glucosamine-Chondroitin (GLUCOSAMINE CHONDR COMPLEX PO) Take 1 tablet by mouth daily.     . Multiple Vitamin (MULTIVITAMIN) tablet Take 1 tablet by mouth daily.      . Omega-3 Fatty Acids (FISH OIL PO) Take by mouth.      . valACYclovir (VALTREX) 500 MG tablet Take one tablet twice daily for 3-5 days the prn as needed 30 tablet 1  . venlafaxine XR (EFFEXOR-XR) 75 MG 24 hr capsule 2 tabs daily 60 capsule 2  . VITAMIN E PO Take by mouth.      . zolpidem (AMBIEN) 5 MG tablet Take 1 tablet (5 mg total) by mouth at bedtime as needed for sleep. 30 tablet 0   No current facility-administered medications on file prior to visit.    Allergies  Allergen Reactions  . Ampicillin     REACTION: Rash    Family History  Problem Relation Age of Onset  . Parkinsonism Sister  BP 120/78 mmHg  Pulse 77  Temp(Src) 97.8 F (36.6 C) (Oral)  Wt 145 lb (65.772 kg)  SpO2 93%  LMP 12/18/2003     Review of Systems  Constitutional: Negative for fever and unexpected weight change.  HENT: Negative for hearing loss.   Eyes: Negative for visual disturbance.  Respiratory: Negative for shortness of breath.   Cardiovascular: Negative for chest pain.  Gastrointestinal: Negative for anal bleeding.  Endocrine: Negative for cold intolerance.  Genitourinary: Negative for hematuria.  Musculoskeletal: Positive for back pain.  Skin: Negative for rash.  Allergic/Immunologic: Positive for environmental allergies.  Neurological: Negative for syncope and numbness.  Hematological: Does not bruise/bleed easily.  Psychiatric/Behavioral: Negative for dysphoric mood.       Objective:   Physical Exam VS: see vs page GEN: no distress HEAD: head: no deformity eyes: no periorbital swelling, no proptosis external nose and ears are normal mouth: no lesion seen NECK: supple, thyroid is not  enlarged CHEST WALL: chronic deformity is again noted LUNGS:  Clear to auscultation BREASTS: sees gyn.   CV: reg rate and rhythm, no murmur ABD: abdomen is soft, nontender.  no hepatosplenomegaly.  not distended.  no hernia GENITALIA/RECTAL: sees gyn.   MUSCULOSKELETAL: muscle bulk and strength are grossly normal.  no obvious joint swelling.  gait is normal and steady EXTEMITIES: no deformity, except for a few hammer toes.  no ulcer on the feet.  feet are of normal color and temp.  no edema PULSES: dorsalis pedis intact bilat.  no carotid bruit NEURO:  cn 2-12 grossly intact.   readily moves all 4's.  sensation is intact to touch on the feet SKIN:  Normal texture and temperature.  No rash or suspicious lesion is visible.   NODES:  None palpable at the neck.   PSYCH: alert, well-oriented.  Does not appear anxious nor depressed.   i reviewed electrocardiogram.       Assessment & Plan:    Patient is advised the following: Patient Instructions  please consider these measures for your health:  minimize alcohol.  do not use tobacco products.  have a colonoscopy at least every 10 years from age 33.  Women should have an annual mammogram from age 76.  keep firearms safely stored.  always use seat belts.  have working smoke alarms in your home.  see an eye doctor and dentist regularly.  never drive under the influence of alcohol or drugs (including prescription drugs).  those with fair skin should take precautions against the sun.  blood tests are being requested for you today.  We'll let you know about the results.  Please return in 1 year.

## 2014-01-21 LAB — URINALYSIS, ROUTINE W REFLEX MICROSCOPIC
Bilirubin Urine: NEGATIVE
GLUCOSE, UA: NEGATIVE mg/dL
HGB URINE DIPSTICK: NEGATIVE
KETONES UR: 15 mg/dL — AB
LEUKOCYTES UA: NEGATIVE
Nitrite: NEGATIVE
PH: 6.5 (ref 5.0–8.0)
PROTEIN: NEGATIVE mg/dL
Specific Gravity, Urine: <1.005 — ABNORMAL LOW (ref 1.005–1.030)
Urobilinogen, UA: 0.2 mg/dL (ref 0.0–1.0)

## 2014-02-02 ENCOUNTER — Other Ambulatory Visit: Payer: Self-pay | Admitting: Endocrinology

## 2014-02-04 ENCOUNTER — Other Ambulatory Visit: Payer: BC Managed Care – PPO

## 2014-02-05 ENCOUNTER — Ambulatory Visit
Admission: RE | Admit: 2014-02-05 | Discharge: 2014-02-05 | Disposition: A | Discharge status: Home / Self Care | Payer: BC Managed Care – PPO | Source: Ambulatory Visit | Attending: Gynecology | Admitting: Gynecology

## 2014-02-05 DIAGNOSIS — N63 Unspecified lump in unspecified breast: Secondary | ICD-10-CM

## 2014-02-16 ENCOUNTER — Encounter: Payer: Self-pay | Admitting: Women's Health

## 2014-02-16 ENCOUNTER — Ambulatory Visit (INDEPENDENT_AMBULATORY_CARE_PROVIDER_SITE_OTHER): Payer: BC Managed Care – PPO | Admitting: Women's Health

## 2014-02-16 VITALS — BP 128/80

## 2014-02-16 DIAGNOSIS — Z01419 Encounter for gynecological examination (general) (routine) without abnormal findings: Secondary | ICD-10-CM

## 2014-02-16 NOTE — Progress Notes (Signed)
Melissa [name] 14-Feb-1956 [id number]    History:    Presents for annual exam.  Postmenopausal/no HRT/no bleeding. Normal Pap and mammogram history. 2015 left breast had follow-up ultrasound appears stable. Severe scoliosis (Harrington rods age 58) primary care manages. 2009 negative colonoscopy. Has had Zostavax.  Past medical history, past surgical history, family history and social history were all reviewed and documented in the EPIC chart. Desk job. IBS.  ROS:  A  12 point ROS was performed and pertinent positives and negatives are included.  Exam:  Filed Vitals:   02/16/14 1508  BP: 128/80    General appearance:  Scoliosis  Thyroid:  Symmetrical, normal in size, without palpable masses or nodularity. Respiratory  Auscultation:  Clear without wheezing or rhonchi Cardiovascular  Auscultation:  Regular rate, without rubs, murmurs or gallops  Edema/varicosities:  Not grossly evident Abdominal  Soft,nontender, without masses, guarding or rebound.  Liver/spleen:  No organomegaly noted  Hernia:  None appreciated  Skin  Inspection:  Grossly normal   Breasts: Examined lying and sitting.     Right: Without masses, retractions, discharge or axillary adenopathy.     Left: Without masses, retractions, discharge or axillary adenopathy. Gentitourinary   Inguinal/mons:  Normal without inguinal adenopathy  External genitalia:  Normal  BUS/Urethra/Skene's glands:  Normal  Vagina:  Atrophic Cervix:  Normal  Uterus:   normal in size, shape and contour.  Midline and mobile  Adnexa/parametria:     Rt: Without masses or tenderness.   Lt: Without masses or tenderness.  Anus and perineum: Normal  Digital rectal exam: Normal sphincter tone without palpated masses or tenderness  Assessment/Plan:  58 y.o. MWF G0 for annual exam with no complaints.  Scoliosis primary care manages DEXA IBS Left breast ultrasound every 6 months to check stability Postmenopausal/no bleeding/no HRT Primary  care manages labs  Plan: SBE's, continue annual mammogram and 6 months ultrasound as scheduled. Home safety, fall prevention and importance of regular exercise reviewed. Pap normal 2014, new screening guidelines reviewed. [facility], 6:24 PM 02/16/2014

## 2014-02-16 NOTE — Patient Instructions (Signed)
Health Recommendations for Postmenopausal Women Respected and ongoing research has looked at the most common causes of death, disability, and poor quality of life in postmenopausal women. The causes include heart disease, diseases of blood vessels, diabetes, depression, cancer, and bone loss (osteoporosis). Many things can be done to help lower the chances of developing these and other common problems. CARDIOVASCULAR DISEASE Heart Disease: A heart attack is a medical emergency. Know the signs and symptoms of a heart attack. Below are things women can do to reduce their risk for heart disease.   Do not smoke. If you smoke, quit.  Aim for a healthy weight. Being overweight causes many preventable deaths. Eat a healthy and balanced diet and drink an adequate amount of liquids.  Get moving. Make a commitment to be more physically active. Aim for 30 minutes of activity on most, if not all days of the week.  Eat for heart health. Choose a diet that is low in saturated fat and cholesterol and eliminate trans fat. Include whole grains, vegetables, and fruits. Read and understand the labels on food containers before buying.  Know your numbers. Ask your caregiver to check your blood pressure, cholesterol (total, HDL, LDL, triglycerides) and blood glucose. Work with your caregiver on improving your entire clinical picture.  High blood pressure. Limit or stop your table salt intake (try salt substitute and food seasonings). Avoid salty foods and drinks. Read labels on food containers before buying. Eating well and exercising can help control high blood pressure. STROKE  Stroke is a medical emergency. Stroke may be the result of a blood clot in a blood vessel in the brain or by a brain hemorrhage (bleeding). Know the signs and symptoms of a stroke. To lower the risk of developing a stroke:  Avoid fatty foods.  Quit smoking.  Control your diabetes, blood pressure, and irregular heart rate. THROMBOPHLEBITIS  (BLOOD CLOT) OF THE LEG  Becoming overweight and leading a stationary lifestyle may also contribute to developing blood clots. Controlling your diet and exercising will help lower the risk of developing blood clots. CANCER SCREENING  Breast Cancer: Take steps to reduce your risk of breast cancer.  You should practice "breast self-awareness." This means understanding the normal appearance and feel of your breasts and should include breast self-examination. Any changes detected, no matter how small, should be reported to your caregiver.  After age 40, you should have a clinical breast exam (CBE) every year.  Starting at age 40, you should consider having a mammogram (breast X-ray) every year.  If you have a family history of breast cancer, talk to your caregiver about genetic screening.  If you are at high risk for breast cancer, talk to your caregiver about having an MRI and a mammogram every year.  Intestinal or Stomach Cancer: Tests to consider are a rectal exam, fecal occult blood, sigmoidoscopy, and colonoscopy. Women who are high risk may need to be screened at an earlier age and more often.  Cervical Cancer:  Beginning at age 30, you should have a Pap test every 3 years as long as the past 3 Pap tests have been normal.  If you have had past treatment for cervical cancer or a condition that could lead to cancer, you need Pap tests and screening for cancer for at least 20 years after your treatment.  If you had a hysterectomy for a problem that was not cancer or a condition that could lead to cancer, then you no longer need Pap tests.    If you are between ages 65 and 70, and you have had normal Pap tests going back 10 years, you no longer need Pap tests.  If Pap tests have been discontinued, risk factors (such as a new sexual partner) need to be reassessed to determine if screening should be resumed.  Some medical problems can increase the chance of getting cervical cancer. In these  cases, your caregiver may recommend more frequent screening and Pap tests.  Uterine Cancer: If you have vaginal bleeding after reaching menopause, you should notify your caregiver.  Ovarian Cancer: Other than yearly pelvic exams, there are no reliable tests available to screen for ovarian cancer at this time except for yearly pelvic exams.  Lung Cancer: Yearly chest X-rays can detect lung cancer and should be done on high risk women, such as cigarette smokers and women with chronic lung disease (emphysema).  Skin Cancer: A complete body skin exam should be done at your yearly examination. Avoid overexposure to the sun and ultraviolet light lamps. Use a strong sun block cream when in the sun. All of these things are important for lowering the risk of skin cancer. MENOPAUSE Menopause Symptoms: Hormone therapy products are effective for treating symptoms associated with menopause:  Moderate to severe hot flashes.  Night sweats.  Mood swings.  Headaches.  Tiredness.  Loss of sex drive.  Insomnia.  Other symptoms. Hormone replacement carries certain risks, especially in older women. Women who use or are thinking about using estrogen or estrogen with progestin treatments should discuss that with their caregiver. Your caregiver will help you understand the benefits and risks. The ideal dose of hormone replacement therapy is not known. The Food and Drug Administration (FDA) has concluded that hormone therapy should be used only at the lowest doses and for the shortest amount of time to reach treatment goals.  OSTEOPOROSIS Protecting Against Bone Loss and Preventing Fracture If you use hormone therapy for prevention of bone loss (osteoporosis), the risks for bone loss must outweigh the risk of the therapy. Ask your caregiver about other medications known to be safe and effective for preventing bone loss and fractures. To guard against bone loss or fractures, the following is recommended:  If  you are younger than age 50, take 1000 mg of calcium and at least 600 mg of Vitamin D per day.  If you are older than age 50 but younger than age 70, take 1200 mg of calcium and at least 600 mg of Vitamin D per day.  If you are older than age 70, take 1200 mg of calcium and at least 800 mg of Vitamin D per day. Smoking and excessive alcohol intake increases the risk of osteoporosis. Eat foods rich in calcium and vitamin D and do weight bearing exercises several times a week as your caregiver suggests. DIABETES Diabetes Mellitus: If you have type I or type 2 diabetes, you should keep your blood sugar under control with diet, exercise, and recommended medication. Avoid starchy and fatty foods, and too many sweets. Being overweight can make diabetes control more difficult. COGNITION AND MEMORY Cognition and Memory: Menopausal hormone therapy is not recommended for the prevention of cognitive disorders such as Alzheimer's disease or memory loss.  DEPRESSION  Depression may occur at any age, but it is common in elderly women. This may be because of physical, medical, social (loneliness), or financial problems and needs. If you are experiencing depression because of medical problems and control of symptoms, talk to your caregiver about this. Physical   activity and exercise may help with mood and sleep. Community and volunteer involvement may improve your sense of value and worth. If you have depression and you feel that the problem is getting worse or becoming severe, talk to your caregiver about which treatment options are best for you. ACCIDENTS  Accidents are common and can be serious in elderly woman. Prepare your house to prevent accidents. Eliminate throw rugs, place hand bars in bath, shower, and toilet areas. Avoid wearing high heeled shoes or walking on wet, snowy, and icy areas. Limit or stop driving if you have vision or hearing problems, or if you feel you are unsteady with your movements and  reflexes. HEPATITIS C Hepatitis C is a type of viral infection affecting the liver. It is spread mainly through contact with blood from an infected person. It can be treated, but if left untreated, it can lead to severe liver damage over the years. Many people who are infected do not know that the virus is in their blood. If you are a "baby-boomer", it is recommended that you have one screening test for Hepatitis C. IMMUNIZATIONS  Several immunizations are important to consider having during your senior years, including:   Tetanus, diphtheria, and pertussis booster shot.  Influenza every year before the flu season begins.  Pneumonia vaccine.  Shingles vaccine.  Others, as indicated based on your specific needs. Talk to your caregiver about these. Document Released: 04/13/2005 Document Revised: 07/06/2013 Document Reviewed: 12/08/2007 Exit[employer] Patient Information 2015 [employer]. This information is not intended to replace advice given to you by your health care provider. Make sure you discuss any questions you have with your health care provider.  

## 2014-02-17 LAB — URINALYSIS W MICROSCOPIC + REFLEX CULTURE
Bacteria, UA: NONE SEEN
Bilirubin Urine: NEGATIVE
Casts: NONE SEEN
Crystals: NONE SEEN
GLUCOSE, UA: NEGATIVE mg/dL
Hgb urine dipstick: NEGATIVE
Ketones, ur: NEGATIVE mg/dL
LEUKOCYTES UA: NEGATIVE
Nitrite: NEGATIVE
PROTEIN: NEGATIVE mg/dL
Specific Gravity, Urine: 1.007 (ref 1.005–1.030)
Squamous Epithelial / LPF: NONE SEEN
UROBILINOGEN UA: 0.2 mg/dL (ref 0.0–1.0)
pH: 7 (ref 5.0–8.0)

## 2014-02-18 ENCOUNTER — Inpatient Hospital Stay: Admission: RE | Admit: 2014-02-18 | Payer: BC Managed Care – PPO | Source: Ambulatory Visit

## 2014-02-19 ENCOUNTER — Ambulatory Visit (INDEPENDENT_AMBULATORY_CARE_PROVIDER_SITE_OTHER)
Admission: RE | Admit: 2014-02-19 | Discharge: 2014-02-19 | Disposition: A | Discharge status: Home / Self Care | Payer: BC Managed Care – PPO | Source: Ambulatory Visit | Attending: Endocrinology | Admitting: Endocrinology

## 2014-02-19 DIAGNOSIS — M810 Age-related osteoporosis without current pathological fracture: Secondary | ICD-10-CM

## 2014-03-08 ENCOUNTER — Other Ambulatory Visit: Payer: Self-pay | Admitting: Endocrinology

## 2014-03-15 ENCOUNTER — Encounter: Payer: Self-pay | Admitting: Endocrinology

## 2014-03-15 ENCOUNTER — Ambulatory Visit (INDEPENDENT_AMBULATORY_CARE_PROVIDER_SITE_OTHER): Payer: BLUE CROSS/BLUE SHIELD | Admitting: Endocrinology

## 2014-03-15 VITALS — BP 124/84 | HR 73 | Temp 98.2°F | Ht 66.5 in | Wt 145.0 lb

## 2014-03-15 DIAGNOSIS — J209 Acute bronchitis, unspecified: Secondary | ICD-10-CM

## 2014-03-15 MED ORDER — AZITHROMYCIN 500 MG PO TABS: 500.0000 mg | ORAL_TABLET | Freq: Every day | ORAL | Status: DC

## 2014-03-15 MED ORDER — PROMETHAZINE-CODEINE 6.25-10 MG/5ML PO SYRP: 5.0000 mL | ORAL_SOLUTION | ORAL | Status: DC | PRN

## 2014-03-15 MED ORDER — FLUTICASONE-SALMETEROL 100-50 MCG/DOSE IN AEPB: 1.0000 | INHALATION_SPRAY | Freq: Two times a day (BID) | RESPIRATORY_TRACT | Status: DC

## 2014-03-15 NOTE — Progress Notes (Signed)
Subjective:    Patient ID: Melissa [name], female    DOB: 1955-10-23, 59 y.o.   MRN: [id number]  HPI Pt states 2 weeks of moderate pain at the throat, and assoc slight prod-quality cough.  Denies nasal congestion and earache. Past Medical History  Diagnosis Date  . Fibroid aug. 2010  . Infertility   . IBS (irritable bowel syndrome)   . Scoliosis   . Allergic rhinitis   . Arthritis     Past Surgical History  Procedure Laterality Date  . Harrington rods      AGE 59  . Hand surgery  2009    LEFT  . Right hand surgery  2013    History   Social History  . Marital Status: Married    Spouse Name: N/A    Number of Children: N/A  . Years of Education: N/A   Occupational History  . Not on file.   Social History Main Topics  . Smoking status: Never Smoker   . Smokeless tobacco: Never Used  . Alcohol Use: Yes     Comment: sometimes  . Drug Use: No  . Sexual Activity:    Partners: Male    Birth Control/ Protection: Post-menopausal   Other Topics Concern  . Not on file   Social History Narrative   Pt has about 3 caffeine drinks daily ( tea)    Current Outpatient Prescriptions on File Prior to Visit  Medication Sig Dispense Refill  . ALPRAZolam (XANAX) 0.25 MG tablet Take 1 tablet (0.25 mg total) by mouth 3 (three) times daily as needed for anxiety. 30 tablet 1  . Ascorbic Acid (VITAMIN C) 1000 MG tablet Take 1,000 mg by mouth daily.      . Azelastine HCl (ASTEPRO) 0.15 % SOLN Place into the nose as directed.     . Calcium Carbonate-Vit D-Min (CALCIUM 1200) 1200-1000 MG-UNIT CHEW Chew 1 tablet by mouth daily.      . carisoprodol (SOMA) 350 MG tablet TAKE 1 TABLET BY MOUTH 3 TIMES A DAY AS NEEDED FOR PAIN 50 tablet 0  . celecoxib (CELEBREX) 200 MG capsule Take 1 capsule (200 mg total) by mouth 2 (two) times daily. 60 capsule 11  . cetirizine (ZYRTEC) 10 MG tablet Take 10 mg by mouth daily.      . cholecalciferol (VITAMIN D) 400 UNITS TABS Take 400 Units by mouth  daily.      . fluticasone (FLONASE) 50 MCG/ACT nasal spray Place 2 sprays into both nostrils daily. 16 g 11  . Glucosamine-Chondroitin (GLUCOSAMINE CHONDR COMPLEX PO) Take 1 tablet by mouth daily.     Marland Kitchen levocetirizine (XYZAL) 5 MG tablet Take 5 mg by mouth every evening.  5  . montelukast (SINGULAIR) 10 MG tablet Take 10 mg by mouth every evening.  5  . Multiple Vitamin (MULTIVITAMIN) tablet Take 1 tablet by mouth daily.      . Omega-3 Fatty Acids (FISH OIL PO) Take 1 tablet by mouth daily.     . valACYclovir (VALTREX) 500 MG tablet Take one tablet twice daily for 3-5 days the prn as needed 30 tablet 1  . venlafaxine XR (EFFEXOR-XR) 75 MG 24 hr capsule TAKE 2 CAPSULES BY MOUTH EVERY DAY 60 capsule 2  . VITAMIN E PO Take 1 tablet by mouth daily.     Marland Kitchen zolpidem (AMBIEN) 5 MG tablet Take 1 tablet (5 mg total) by mouth at bedtime as needed for sleep. 30 tablet 0   No current facility-administered medications  on file prior to visit.    Allergies  Allergen Reactions  . Ampicillin     REACTION: Rash    Family History  Problem Relation Age of Onset  . Parkinsonism Sister     BP 124/84 mmHg  Pulse 73  Temp(Src) 98.2 F (36.8 C) (Oral)  Ht 5' 6.5" (1.689 m)  Wt 145 lb (65.772 kg)  BMI 23.06 kg/m2  SpO2 98%  LMP 12/18/2003    Review of Systems Denies fever and SOB, but she has slight wheezing.    Objective:   Physical Exam VITAL SIGNS:  See vs page GENERAL: no distress head: no deformity eyes: no periorbital swelling, no proptosis external nose and ears are normal mouth: no lesion seen Both eac's and tm's are normal LUNGS:  Clear to auscultation.         Assessment & Plan:  Acute bronchitis, new Insomnia: Lorrin Mais can interact with cough syrup.  Patient is advised the following: Patient Instructions  i have sent 2 prescriptions to your pharmacy: for an antibiotic pill, and an inhaler. Here is a prescription for cough syrup.  As it causes drowsiness, you should avoid  ambien with this.   I hope you feel better soon.  If you don't feel better by next week, please call back.  Please call sooner if you get worse.

## 2014-03-15 NOTE — Patient Instructions (Addendum)
i have sent 2 prescriptions to your pharmacy: for an antibiotic pill, and an inhaler. Here is a prescription for cough syrup.  As it causes drowsiness, you should avoid ambien with this.   I hope you feel better soon.  If you don't feel better by next week, please call back.  Please call sooner if you get worse.

## 2014-04-07 ENCOUNTER — Other Ambulatory Visit: Payer: Self-pay | Admitting: Endocrinology

## 2014-05-12 ENCOUNTER — Other Ambulatory Visit: Payer: Self-pay | Admitting: Endocrinology

## 2014-05-14 ENCOUNTER — Other Ambulatory Visit: Payer: Self-pay | Admitting: *Deleted

## 2014-05-14 MED ORDER — CARISOPRODOL 350 MG PO TABS
ORAL_TABLET | ORAL | Status: DC
Start: 2014-05-14 — End: 2014-08-10

## 2014-08-05 ENCOUNTER — Encounter: Payer: Self-pay | Admitting: Gastroenterology

## 2014-08-10 ENCOUNTER — Other Ambulatory Visit: Payer: Self-pay | Admitting: Endocrinology

## 2014-08-11 ENCOUNTER — Telehealth: Payer: Self-pay

## 2014-08-11 MED ORDER — ZOLPIDEM TARTRATE 5 MG PO TABS: 5.0000 mg | ORAL_TABLET | Freq: Every evening | ORAL | Status: DC | PRN

## 2014-08-11 NOTE — Telephone Encounter (Signed)
Rx faxed per pt's request.  

## 2014-08-11 NOTE — Telephone Encounter (Signed)
i printed 

## 2014-08-11 NOTE — Telephone Encounter (Signed)
Patient is requesting a refill of Zolpidem. Last rx was written on 09/29/2013. Thanks!

## 2014-08-11 NOTE — Telephone Encounter (Signed)
Please advise if ok to refill Soma and Zolpidem. Pt is requesting a refill on both of these medications.

## 2014-08-23 ENCOUNTER — Other Ambulatory Visit: Payer: Self-pay | Admitting: Women's Health

## 2014-08-23 ENCOUNTER — Other Ambulatory Visit: Payer: Self-pay | Admitting: Gynecology

## 2014-08-23 DIAGNOSIS — N6320 Unspecified lump in the left breast, unspecified quadrant: Secondary | ICD-10-CM

## 2014-08-31 ENCOUNTER — Ambulatory Visit
Admission: RE | Admit: 2014-08-31 | Discharge: 2014-08-31 | Disposition: A | Discharge status: Home / Self Care | Payer: BLUE CROSS/BLUE SHIELD | Source: Ambulatory Visit | Attending: Women's Health | Admitting: Women's Health

## 2014-08-31 ENCOUNTER — Other Ambulatory Visit: Payer: Self-pay | Admitting: Women's Health

## 2014-08-31 DIAGNOSIS — N6320 Unspecified lump in the left breast, unspecified quadrant: Secondary | ICD-10-CM

## 2014-09-01 ENCOUNTER — Encounter: Payer: Self-pay | Admitting: Women's Health

## 2014-11-17 ENCOUNTER — Other Ambulatory Visit: Payer: Self-pay | Admitting: Endocrinology

## 2014-12-01 ENCOUNTER — Other Ambulatory Visit: Payer: Self-pay | Admitting: Endocrinology

## 2014-12-02 NOTE — Telephone Encounter (Signed)
Please advise if ok to refill. Last office visit was 03/15/2014.

## 2014-12-07 ENCOUNTER — Telehealth: Payer: Self-pay | Admitting: Endocrinology

## 2014-12-07 NOTE — Telephone Encounter (Signed)
Patient called stating that she would like to know when she will need to be scheduled for a colonoscopy   Please advise    Thank you

## 2014-12-07 NOTE — Telephone Encounter (Signed)
I contacted the pt and advised her next colonoscopy is due 08/21/2017. Pt voiced understanding.

## 2015-01-11 ENCOUNTER — Other Ambulatory Visit: Payer: Self-pay

## 2015-01-11 MED ORDER — VENLAFAXINE HCL ER 75 MG PO CP24: 150.0000 mg | ORAL_CAPSULE | Freq: Every day | ORAL | Status: DC

## 2015-02-17 IMAGING — MG MM DIAGNOSTIC UNILATERAL L
2 series · 2 of 2 positions shown · non-contrast
Comparison: Previous examinations, including the screening
mammogram with digital breast tomosynthesis dated 07/21/2013.

CLINICAL DATA: Possible left breast mass at recent screening
mammography. The patient reports having bilateral breast masses
surgically resected approximately 20 years ago in [facility] with
benign findings. She also reports that they found a third mass at
that time that they did not resect after two benign resections.

EXAM:
DIGITAL DIAGNOSTIC  LEFT MAMMOGRAM
ULTRASOUND LEFT BREAST

[L CC]
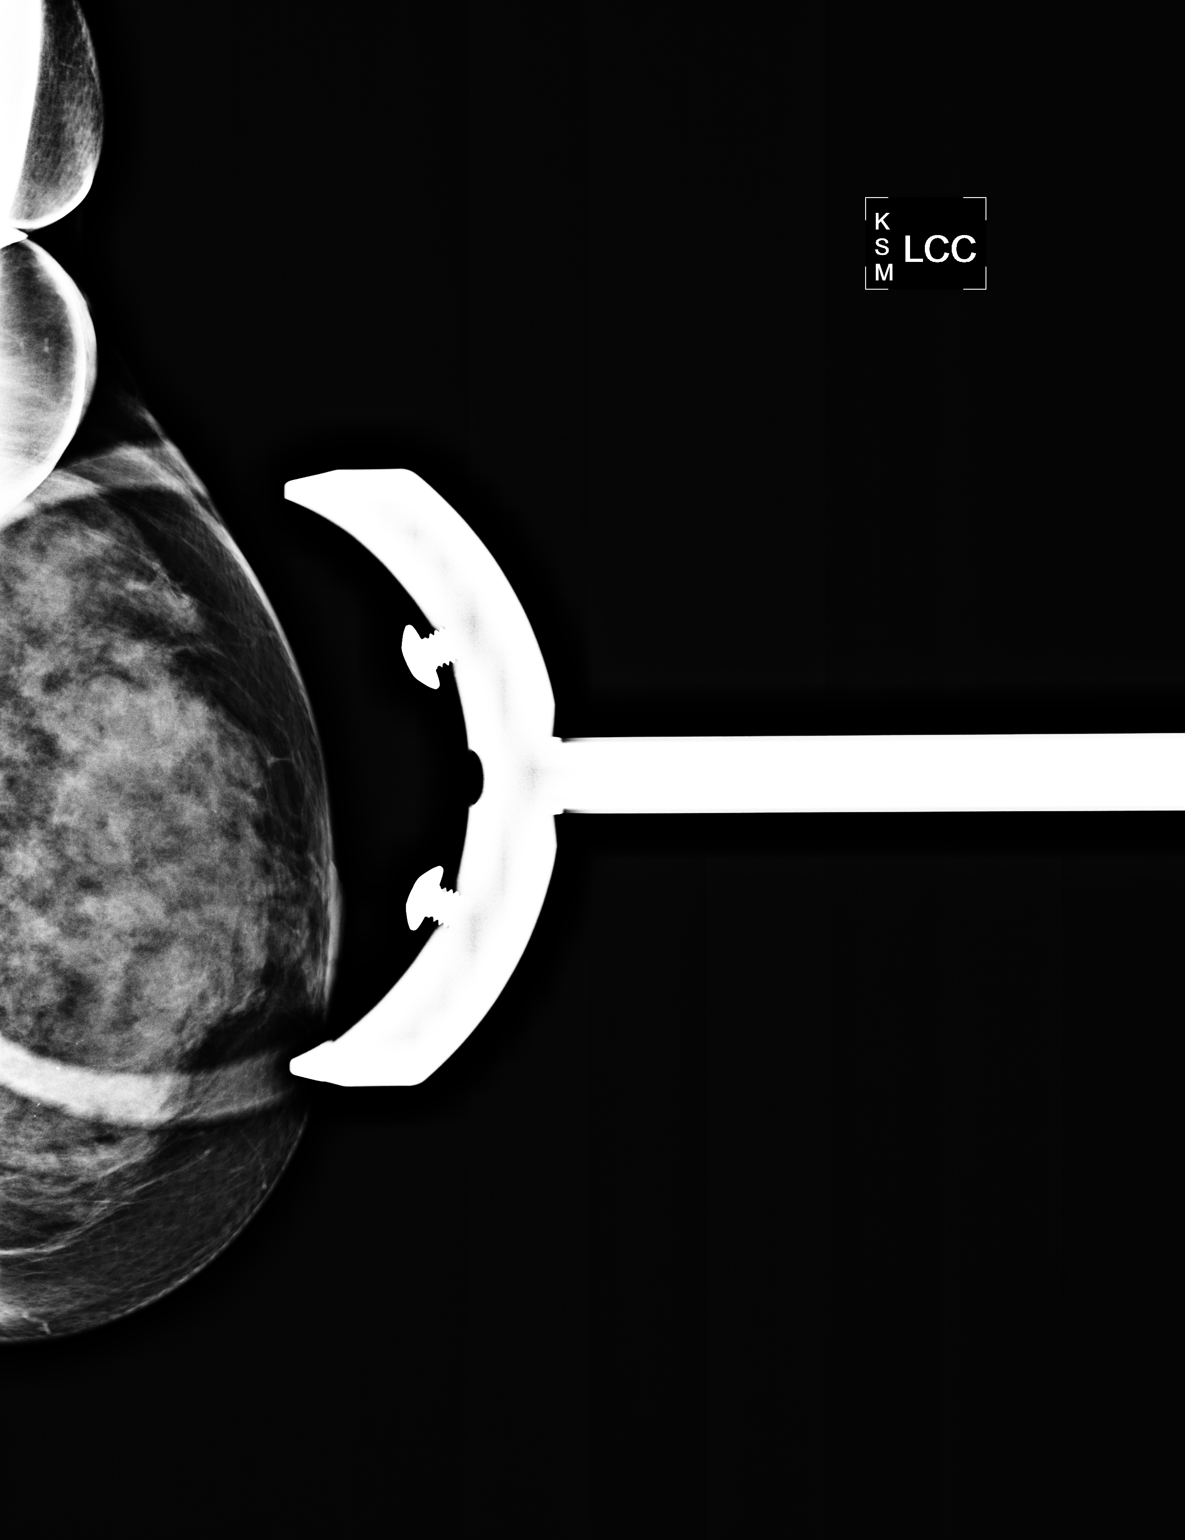

[L MLO]
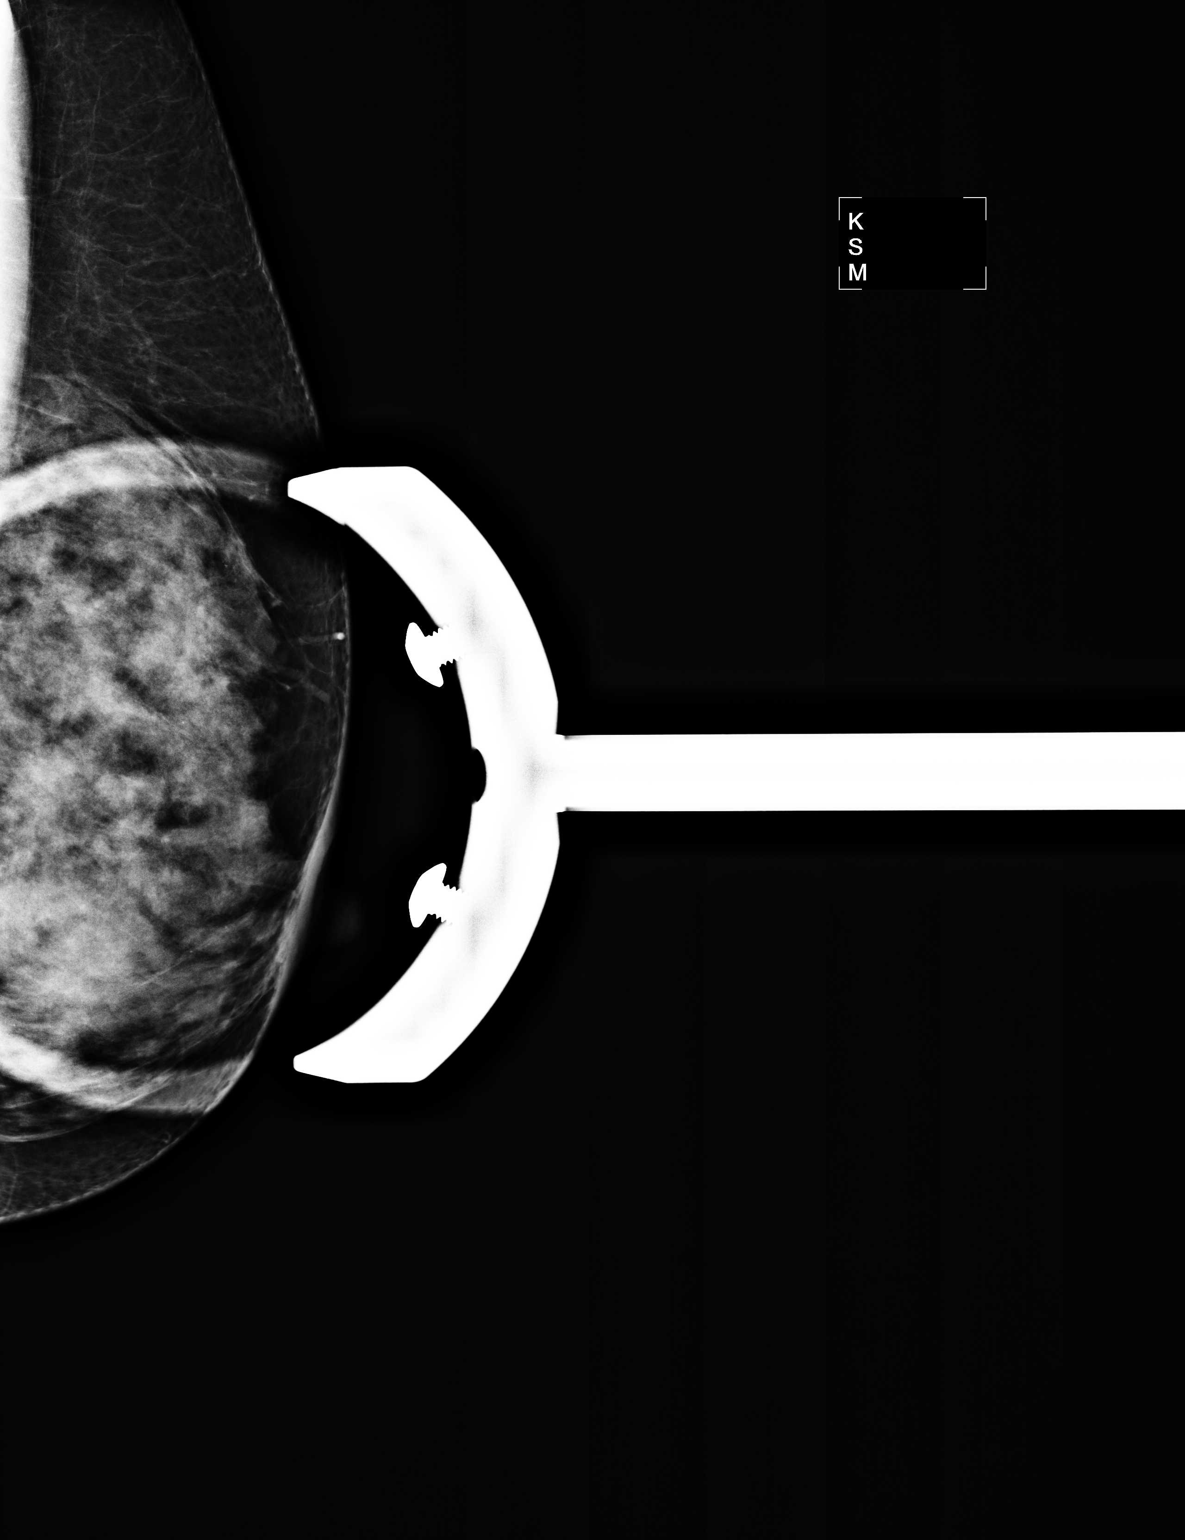

[2 of 2 positions shown; findings below may reference images not displayed]

ACR Breast Density Category c: The breast tissue is heterogeneously
dense, which may obscure small masses.
FINDINGS: Spot compression views of the left breast confirm a rounded,
partially obscured mass in the outer left breast with partially
obscured margins. The visualized margins are circumscribed. This is
not visible on the recent routine 2D images or on previous 2D
examinations. This is only seen on the recent 3D images, and has
circumscribed margins on those images.

On physical exam, no mass is palpable in the outer left breast.

Ultrasound is performed, showing an 8 x 8 x 5 mm oval, horizontally
oriented, circumscribed, hypoechoic mass in the 3 o'clock position
of the left breast, 2 cm from the nipple. This has minimally
irregular (lobulated) margins.

Also demonstrated is an 8 x 6 x 4 mm oval, horizontally oriented,
circumscribed mass in the 2 o'clock position of the left breast, 3
cm from the nipple. This has a partial thin internal septation.

Also demonstrated is a 7 x 6 x 4 mm oval, horizontally oriented,
circumscribed mass in the 3 o'clock position of the left breast, 3
cm from the nipple. This contains thin internal septations.
IMPRESSION: Left breast probable fibroadenomas, as described above. Since one of
these has minimally irregular margins, a 6 month followup left
breast ultrasound is recommended to assess stability of that mass
(the 8 x 8 x 5 mm mass in the 3 o'clock position, 2 cm from the
nipple). The option of ultrasound-guided core needle biopsy was
discussed with the patient but not recommended at this time. She is
currently comfortable with the 6 month followup ultrasound.

RECOMMENDATION:
Left breast ultrasound in 6 months.

I have discussed the findings and recommendations with the patient.
Results were also provided in writing at the conclusion of the
visit. If applicable, a reminder letter will be sent to the patient
regarding the next appointment.

BI-RADS CATEGORY  3: Probably benign.

## 2015-02-21 ENCOUNTER — Other Ambulatory Visit: Payer: Self-pay | Admitting: Endocrinology

## 2015-03-30 ENCOUNTER — Other Ambulatory Visit (HOSPITAL_COMMUNITY)
Admission: RE | Admit: 2015-03-30 | Discharge: 2015-03-30 | Disposition: A | Discharge status: Home / Self Care | Payer: BLUE CROSS/BLUE SHIELD | Source: Ambulatory Visit | Attending: Gynecology | Admitting: Gynecology

## 2015-03-30 ENCOUNTER — Encounter: Payer: Self-pay | Admitting: Women's Health

## 2015-03-30 ENCOUNTER — Ambulatory Visit (INDEPENDENT_AMBULATORY_CARE_PROVIDER_SITE_OTHER): Payer: BLUE CROSS/BLUE SHIELD | Admitting: Women's Health

## 2015-03-30 VITALS — BP 124/78 | Ht 66.0 in | Wt 151.0 lb

## 2015-03-30 DIAGNOSIS — B009 Herpesviral infection, unspecified: Secondary | ICD-10-CM | POA: Diagnosis not present

## 2015-03-30 DIAGNOSIS — Z01419 Encounter for gynecological examination (general) (routine) without abnormal findings: Secondary | ICD-10-CM

## 2015-03-30 MED ORDER — VALACYCLOVIR HCL 500 MG PO TABS: ORAL_TABLET | ORAL | Status: AC

## 2015-03-30 NOTE — Progress Notes (Signed)
Melissa [name] [birth year] [id number]    History:    Presents for annual exam.  Postmenopausal on no HRT and no bleeding, not sexually active. Normal Pap history. Normal mammograms after ultrasound stable left breast fibroadenoma. History of infertility. Had Zostavax. 2009 negative colonoscopy. 2015 DEXA at primary care left femoral neck -2.1, radius -1.8 FRAX 9.5%/1.4%. Labs primary care.  Past medical history, past surgical history, family history and social history were all reviewed and documented in the EPIC chart. Planning to retire in the next 2 years and moved to the [place]. Bought a boat this past year. Scoliosis Harrington rods at age 60.  ROS:  A ROS was performed and pertinent positives and negatives are included.  Exam:  Filed Vitals:   03/30/59 1550  BP: 124/78    General appearance:  Normal Thyroid:  Symmetrical, normal in size, without palpable masses or nodularity. Respiratory  Auscultation:  Clear without wheezing or rhonchi Cardiovascular  Auscultation:  Regular rate, without rubs, murmurs or gallops  Edema/varicosities:  Not grossly evident Abdominal  Soft,nontender, without masses, guarding or rebound.  Liver/spleen:  No organomegaly noted  Hernia:  None appreciated  Skin  Inspection:  Grossly normal   Breasts: Examined lying and sitting.     Right: Without masses, retractions, discharge or axillary adenopathy.     Left: Without masses, retractions, discharge or axillary adenopathy. Gentitourinary   Inguinal/mons:  Normal without inguinal adenopathy  External genitalia:  Normal  BUS/Urethra/Skene's glands:  Normal  Vagina:  Normal  Cervix:  Normal  Uterus:  normal in size, shape and contour.  Midline and mobile  Adnexa/parametria:     Rt: Without masses or tenderness.   Lt: Without masses or tenderness.  Anus and perineum: Normal  Digital rectal exam: Normal sphincter tone without palpated masses or tenderness  Assessment/Plan:  60 y.o. MWF G0 for  annual exam no complaints.  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 3-D tomography reviewed and encouraged. Home safety, fall prevention and importance of weightbearing exercise reviewed. Valtrex 500 twice daily for 3-5 days as needed, prescription, proper use given and reviewed. Pap with HR HPV typing, new screening guidelines reviewed.  Albin, 4:43 PM 03/30/2015

## 2015-03-30 NOTE — Patient Instructions (Signed)

## 2015-03-31 LAB — URINALYSIS W MICROSCOPIC + REFLEX CULTURE
BACTERIA UA: NONE SEEN [HPF]
Bilirubin Urine: NEGATIVE
CRYSTALS: NONE SEEN [HPF]
Casts: NONE SEEN [LPF]
Glucose, UA: NEGATIVE
Hgb urine dipstick: NEGATIVE
Ketones, ur: NEGATIVE
Nitrite: NEGATIVE
PROTEIN: NEGATIVE
RBC / HPF: NONE SEEN RBC/HPF (ref <=2)
SPECIFIC GRAVITY, URINE: 1.016 (ref 1.001–1.035)
Squamous Epithelial / LPF: NONE SEEN [HPF] (ref <=5)
YEAST: NONE SEEN [HPF]
pH: 6.5 (ref 5.0–8.0)

## 2015-04-02 LAB — URINE CULTURE

## 2015-04-04 LAB — CYTOLOGY - PAP

## 2015-06-01 ENCOUNTER — Other Ambulatory Visit: Payer: Self-pay | Admitting: Endocrinology

## 2015-08-04 ENCOUNTER — Other Ambulatory Visit: Payer: Self-pay | Admitting: Endocrinology

## 2015-08-04 NOTE — Telephone Encounter (Signed)
i printed Ov is due 

## 2015-08-09 ENCOUNTER — Other Ambulatory Visit: Payer: Self-pay | Admitting: Women's Health

## 2015-08-09 DIAGNOSIS — N6320 Unspecified lump in the left breast, unspecified quadrant: Secondary | ICD-10-CM

## 2015-08-20 IMAGING — US US BREAST*L* LIMITED INC AXILLA
1 series · 6 of 6 positions shown · non-contrast
Comparison: Previous exams.

CLINICAL DATA: Followup of a probably benign mass in the outer left
breast.

EXAM:
ULTRASOUND OF THE LEFT BREAST

[Series 1: us breast*left* limited inc axilla · 6 of 6 slices shown]
[im 1/6]
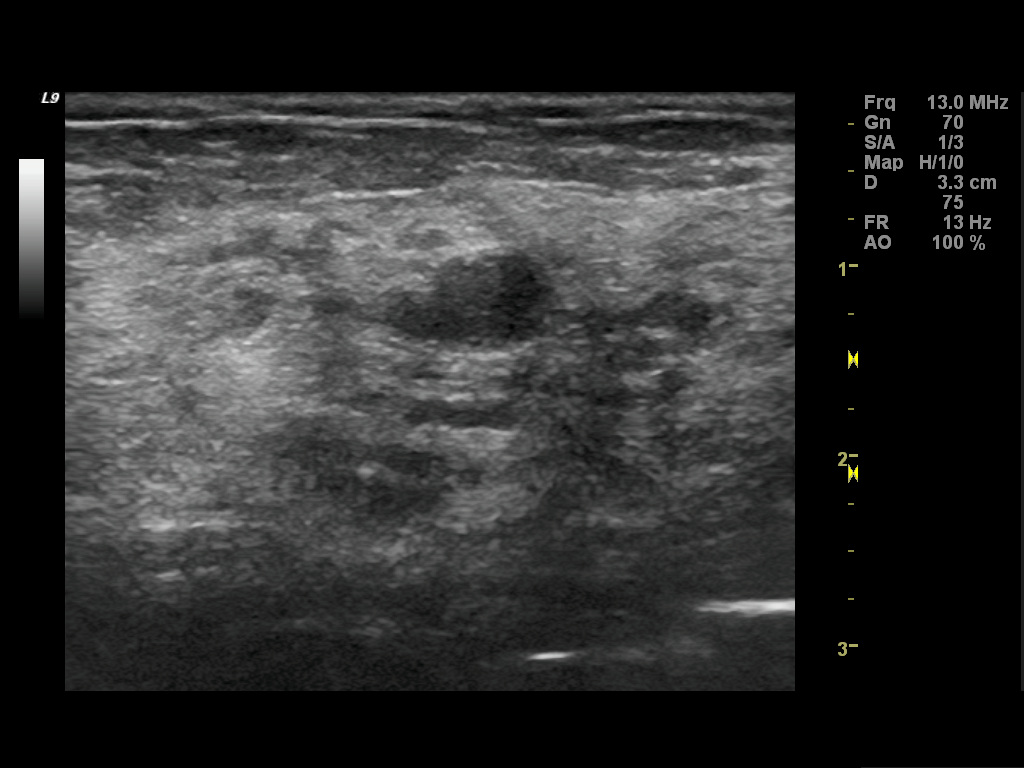
[im 2/6]
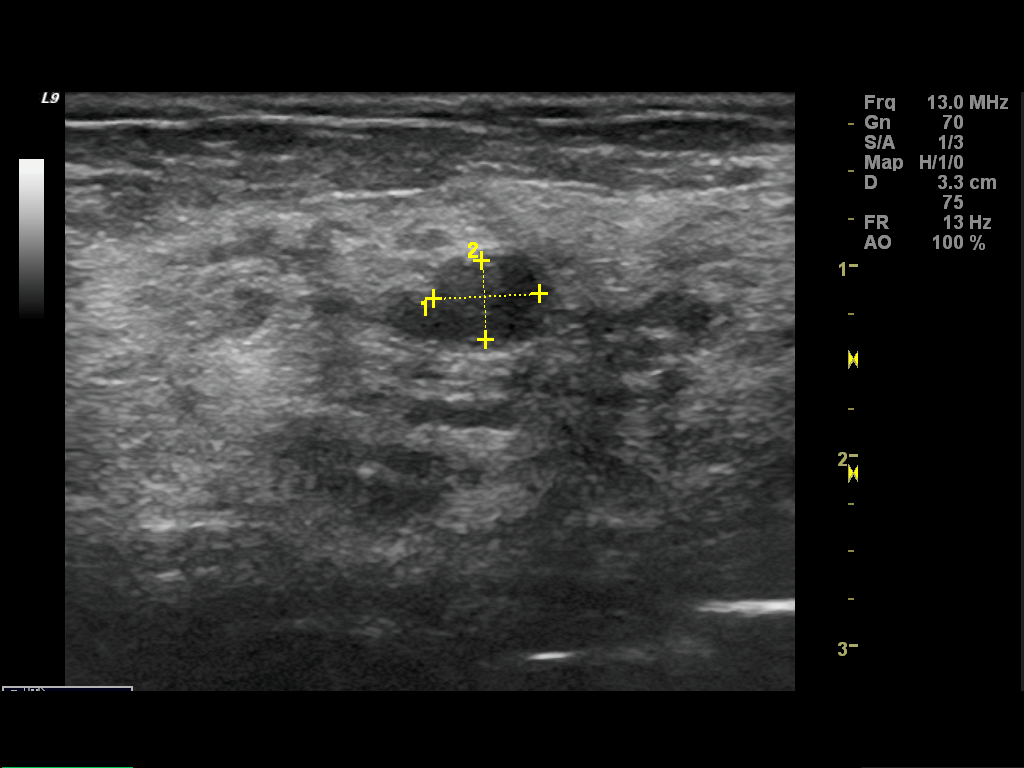
[im 3/6]
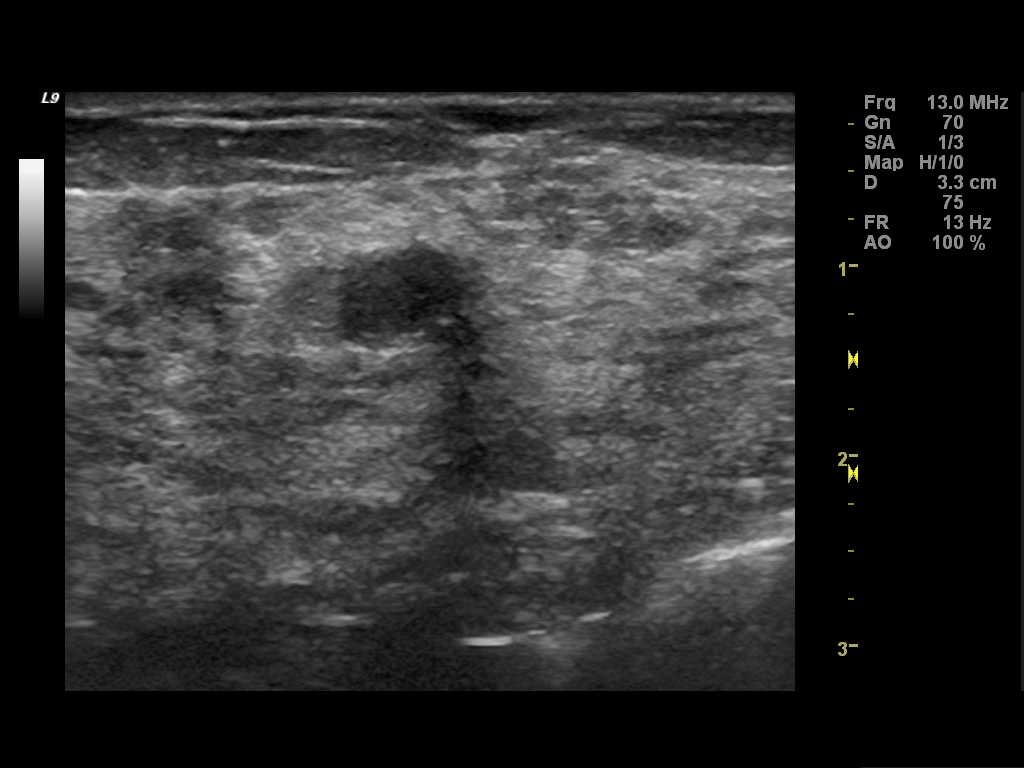
[im 4/6]
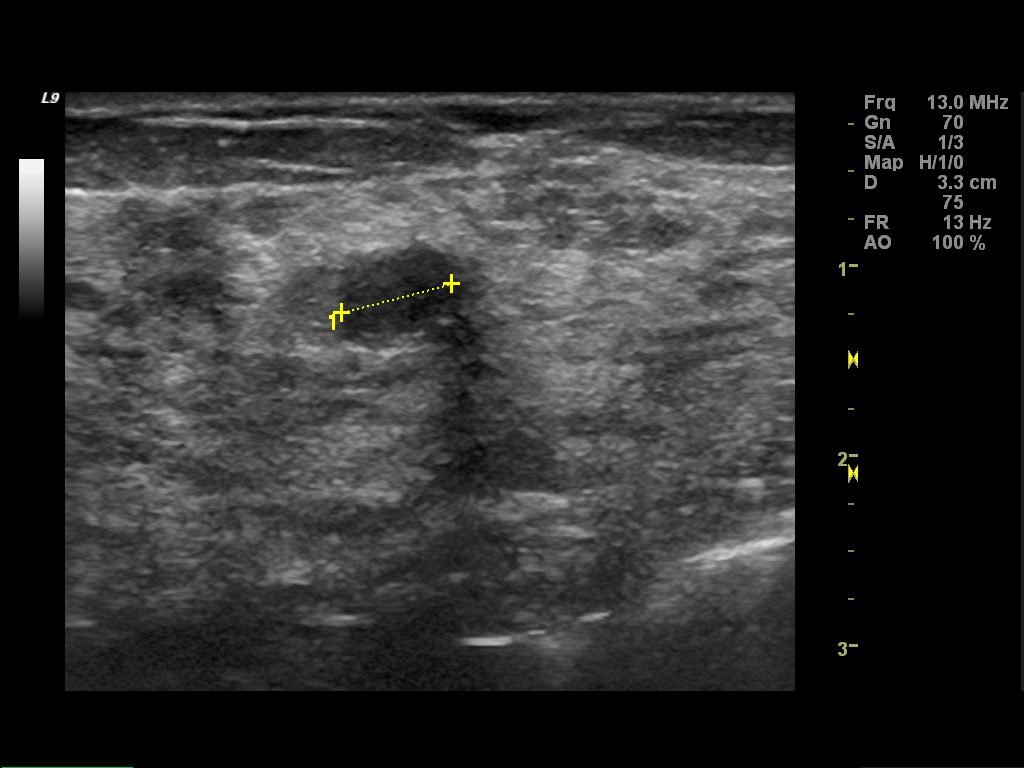
[im 5/6]
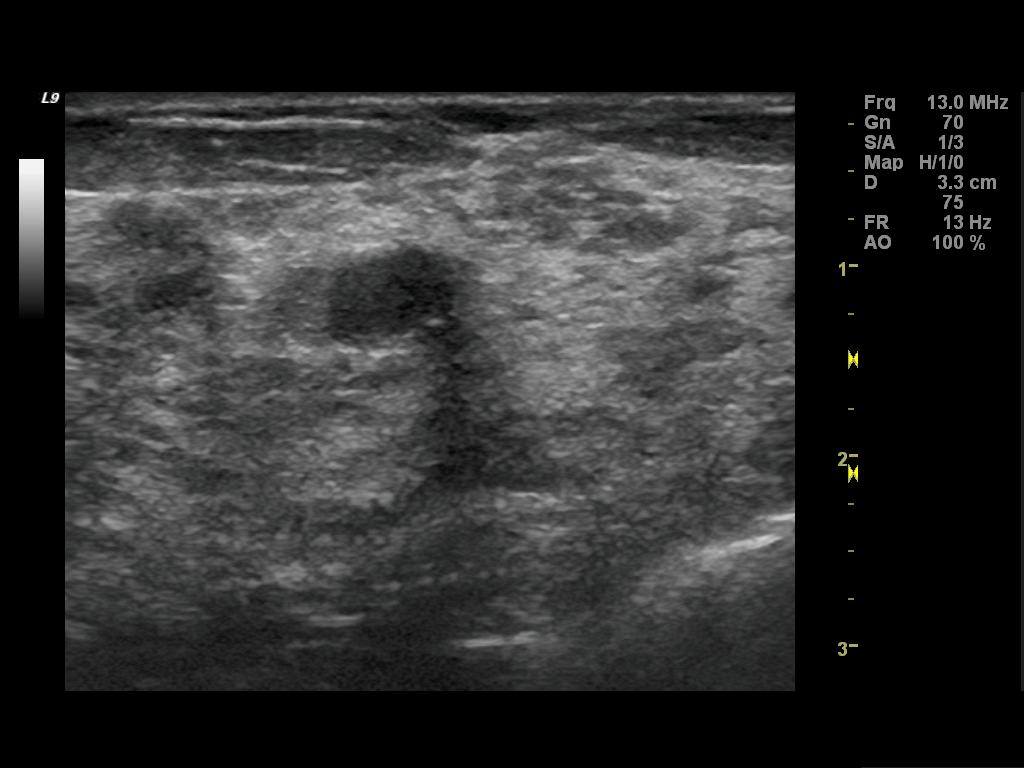
[im 6/6]
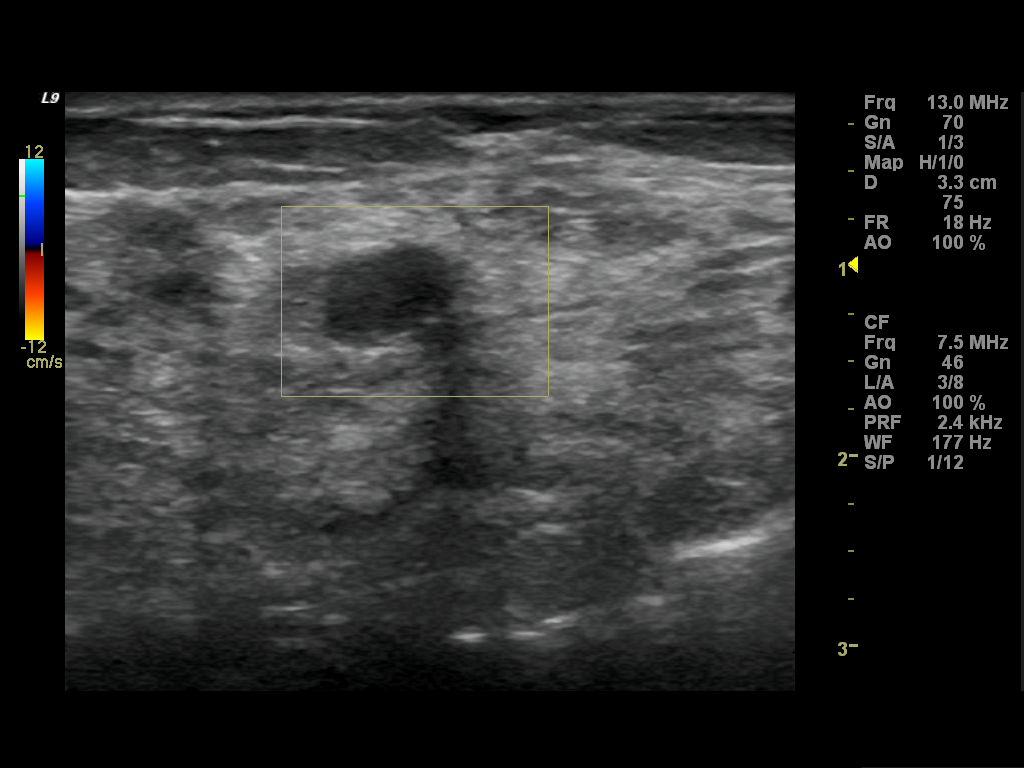

[6 of 6 positions shown; findings below may reference images not displayed]

FINDINGS: Physical examination of the outer left breast is not reveal any
palpable masses.

Targeted ultrasound of the left breast was performed demonstrating
an oval hypoechoic well-circumscribed mass at 3 o'clock 2 cm from
nipple measuring 0.6 x 0.4 x 0.6 cm, previously measured 0.8 x 0.5 x
0.8 cm. This is overall unchanged in size in appearances given
differences in imaging in measurement technique.
IMPRESSION: Unchanged probably benign mass in the outer left breast.

RECOMMENDATION:
Bilateral diagnostic mammography in 6 months with left breast
ultrasound which will demonstrate 1 year of stability of the
probably benign left breast mass.

I have discussed the findings and recommendations with the patient.
Results were also provided in writing at the conclusion of the
visit. If applicable, a reminder letter will be sent to the patient
regarding the next appointment.

BI-RADS CATEGORY  3: Probably benign.

## 2015-08-24 ENCOUNTER — Other Ambulatory Visit: Payer: Self-pay | Admitting: Endocrinology

## 2015-09-01 ENCOUNTER — Ambulatory Visit
Admission: RE | Admit: 2015-09-01 | Discharge: 2015-09-01 | Disposition: A | Discharge status: Home / Self Care | Payer: BLUE CROSS/BLUE SHIELD | Source: Ambulatory Visit | Attending: Women's Health | Admitting: Women's Health

## 2015-09-01 DIAGNOSIS — N6320 Unspecified lump in the left breast, unspecified quadrant: Secondary | ICD-10-CM

## 2015-12-08 ENCOUNTER — Ambulatory Visit (INDEPENDENT_AMBULATORY_CARE_PROVIDER_SITE_OTHER): Payer: BLUE CROSS/BLUE SHIELD | Admitting: Physical Medicine and Rehabilitation

## 2015-12-08 DIAGNOSIS — M545 Low back pain: Secondary | ICD-10-CM | POA: Diagnosis not present

## 2015-12-08 DIAGNOSIS — M47816 Spondylosis without myelopathy or radiculopathy, lumbar region: Secondary | ICD-10-CM

## 2015-12-14 ENCOUNTER — Encounter (INDEPENDENT_AMBULATORY_CARE_PROVIDER_SITE_OTHER): Payer: BLUE CROSS/BLUE SHIELD | Admitting: Physical Medicine and Rehabilitation

## 2015-12-14 DIAGNOSIS — M47816 Spondylosis without myelopathy or radiculopathy, lumbar region: Secondary | ICD-10-CM

## 2015-12-21 ENCOUNTER — Encounter (INDEPENDENT_AMBULATORY_CARE_PROVIDER_SITE_OTHER): Payer: BLUE CROSS/BLUE SHIELD | Admitting: Physical Medicine and Rehabilitation

## 2015-12-21 DIAGNOSIS — M47816 Spondylosis without myelopathy or radiculopathy, lumbar region: Secondary | ICD-10-CM | POA: Diagnosis not present

## 2015-12-28 ENCOUNTER — Encounter (INDEPENDENT_AMBULATORY_CARE_PROVIDER_SITE_OTHER): Payer: BLUE CROSS/BLUE SHIELD | Admitting: Physical Medicine and Rehabilitation

## 2016-01-02 ENCOUNTER — Telehealth (INDEPENDENT_AMBULATORY_CARE_PROVIDER_SITE_OTHER): Payer: Self-pay

## 2016-01-06 NOTE — Telephone Encounter (Signed)
Received auth from [facility] by fax for [phone] and [phone]. Good from 12/27/15-03/26/16. I called pt and spoke with her husband Coralyn Mark and scheduled her for 01/17/16 @ 3:30 w/driver.

## 2016-01-07 ENCOUNTER — Other Ambulatory Visit (INDEPENDENT_AMBULATORY_CARE_PROVIDER_SITE_OTHER): Payer: Self-pay | Admitting: Physical Medicine and Rehabilitation

## 2016-01-17 ENCOUNTER — Ambulatory Visit (INDEPENDENT_AMBULATORY_CARE_PROVIDER_SITE_OTHER): Payer: BLUE CROSS/BLUE SHIELD | Admitting: Physical Medicine and Rehabilitation

## 2016-01-17 ENCOUNTER — Encounter (INDEPENDENT_AMBULATORY_CARE_PROVIDER_SITE_OTHER): Payer: Self-pay | Admitting: Physical Medicine and Rehabilitation

## 2016-01-17 VITALS — BP 127/77 | HR 71

## 2016-01-17 DIAGNOSIS — G8929 Other chronic pain: Secondary | ICD-10-CM

## 2016-01-17 DIAGNOSIS — M47816 Spondylosis without myelopathy or radiculopathy, lumbar region: Secondary | ICD-10-CM

## 2016-01-17 DIAGNOSIS — M545 Low back pain: Secondary | ICD-10-CM | POA: Diagnosis not present

## 2016-01-17 MED ORDER — METHYLPREDNISOLONE ACETATE 80 MG/ML IJ SUSP
80.0000 mg | Freq: Once | INTRAMUSCULAR | Status: AC
  Administered 2016-01-17: 80 mg

## 2016-01-17 MED ORDER — LIDOCAINE HCL (PF) 1 % IJ SOLN: 0.3300 mL | Freq: Once | INTRAMUSCULAR | Status: DC

## 2016-01-17 NOTE — Procedures (Signed)
Lumbar Facet Joint Nerve Denervation  Patient: Melissa [name]      Date of Birth: [birth year] MRN: [id number] PCP: Renato Shin, MD      Visit Date: 01/17/2016   Universal Protocol:    Date/Time: 11/14/174:59 PM  Consent Given By: the patient  Position: PRONE  Additional Comments: Vital signs were monitored before and after the procedure. Patient was prepped and draped in the usual sterile fashion. The correct patient, procedure, and site was verified.   Injection Procedure Details:  Procedure Site One Meds Administered:  Meds ordered this encounter  Medications  . lidocaine (PF) (XYLOCAINE) 1 % injection 0.3 mL  . methylPREDNISolone acetate (DEPO-MEDROL) injection 80 mg     Laterality: Left  Location/Site:  L3-L4 L4-L5  Needle size: 18 G  Needle type: Radiofrequency cannula  Needle Placement: Along juncture of superior articular process and transverse pocess  Findings:  -Comments:  Procedure Details: For each desired target nerve, the corresponding transverse process (sacral ala for the L5 dorsal rami) was identified and the fluoroscope was positioned to square off the endplates of the corresponding vertebral body to achieve a true AP midline view.  The beam was then obliqued 15 to 20 degrees and caudally tilted 15 to 20 degrees to line up a trajectory along the target nerves. The skin over the target of the junction of superior articulating process and transverse process (sacral ala for the L5 dorsal rami) was infiltrated with 25ml of 1% Lidocaine without Epinephrine.  The 18 gauge 31mm active tip outer cannula was advanced in trajectory view to the target.  This procedure was repeated for each target nerve.  Then, for all levels, the outer cannula placement was fine-tuned and the position was then confirmed with bi-planar imaging.    Test stimulation was done both at sensory and motor levels to ensure there was no radicular stimulation. The target tissues were  then infiltrated with 1 ml of 1% Lidocaine without Epinephrine. Subsequently, a percutaneous neurotomy was carried out for 60 seconds at 80 degrees Celsius. The procedure was repeated with the cannula rotated 90 degrees, for duration of 60 seconds, one additional time at each level for a total of two lesions per level.  After the completion of the two lesions, 1 ml of injectate was delivered. It was then repeated for each facet joint nerve mentioned above. Appropriate radiographs were obtained to verify the probe placement during the neurotomy.   Additional Comments:  The patient tolerated the procedure well Dressing: Band-Aid    Post-procedure details: Patient was observed during the procedure. Post-procedure instructions were reviewed.  Patient left the clinic in stable condition.

## 2016-01-17 NOTE — Patient Instructions (Signed)

## 2016-01-17 NOTE — Progress Notes (Signed)
Melissa Melissa [name] Melissa [name] - 60 y.o. female MRN [id number]  Date of birth: 1955/10/02  Office Visit Note: Visit Date: 01/17/2016 PCP: Melissa Shin, MD Referred by: Melissa Shin, MD  Subjective: Chief Complaint  Melissa [name] presents with  . Lower Back - Pain   HPI: Melissa Melissa [name] Melissa [name] is a 60 year old female with scoliosis and spondylosis with facet arthropathy particularly L3 Melissa Melissa [name] Melissa [name] and L4-5 without any stenosis without any radicular pain. She's had chronic knee problems as well has been a issue. We have seen her for conservative care with medication management as well as physical therapy some chiropractic care in Melissa Melissa [name] past. She just hasn't done as well as with Melissa Melissa [name] diagnostic blocks. Those are all documented with pain diary and showing more than 70% relief. We are not completely radiofrequency ablation of Melissa Melissa [name] left L3-4 L4-5 facet joints. Thinks left side may be a little worse than right side.     ROS Otherwise per HPI.  Assessment & Plan: Visit Diagnoses:  1. Spondylosis without myelopathy or radiculopathy, lumbar region   2. Chronic bilateral low back pain without sciatica     Plan: Findings:  Plan today is for radiofrequency ablation of Melissa Melissa [name] left L3-4 and L4-5 facet joint. Please see our prior evaluation and management note for further details and justification.    Meds & Orders:  Meds ordered this encounter  Medications  . lidocaine (PF) (XYLOCAINE) 1 % injection 0.3 mL  . methylPREDNISolone acetate (DEPO-MEDROL) injection 80 mg    Orders Placed This Encounter  Procedures  . Radiofrequency,Lumbar    Follow-up: Return for Schedule for return  for  right side lumbar radiofrequency ablation.   Procedures: No procedures performed  Lumbar Facet Joint Nerve Denervation  Melissa [name]: Melissa Melissa [name] Melissa [name]      Date of Birth: 10-17-1955 MRN: [id number] PCP: Melissa Shin, MD      Visit Date: 01/17/2016   Universal Protocol:    Date/Time: 11/14/174:59 PM  Consent Given By: Melissa Melissa [name]  Melissa [name]  Position: PRONE  Additional Comments: Vital signs were monitored before and after Melissa Melissa [name] procedure. Melissa [name] was prepped and draped in Melissa Melissa [name] usual sterile fashion. Melissa Melissa [name] correct Melissa [name], procedure, and site was verified.   Injection Procedure Details:  Procedure Site One Meds Administered:  Meds ordered this encounter  Medications  . lidocaine (PF) (XYLOCAINE) 1 % injection 0.3 mL  . methylPREDNISolone acetate (DEPO-MEDROL) injection 80 mg     Laterality: Left  Location/Site:  L3-L4 L4-L5  Needle size: 18 G  Needle type: Radiofrequency cannula  Needle Placement: Along juncture of superior articular process and transverse pocess  Findings:  -Comments:  Procedure Details: For each desired target nerve, Melissa Melissa [name] corresponding transverse process (sacral ala for Melissa Melissa [name] L5 dorsal rami) was identified and Melissa Melissa [name] fluoroscope was positioned to square off Melissa Melissa [name] endplates of Melissa Melissa [name] corresponding vertebral body to achieve a true AP midline view.  Melissa Melissa [name] beam was then obliqued 15 to 20 degrees and caudally tilted 15 to 20 degrees to line up a trajectory along Melissa Melissa [name] target nerves. Melissa Melissa [name] skin over Melissa Melissa [name] target of Melissa Melissa [name] junction of superior articulating process and transverse process (sacral ala for Melissa Melissa [name] L5 dorsal rami) was infiltrated with 36ml of 1% Lidocaine without Epinephrine.  Melissa Melissa [name] 18 gauge 12mm active tip outer cannula was advanced in trajectory view to Melissa Melissa [name] target.  This procedure was repeated for each target nerve.  Then, for all levels, Melissa Melissa [name] outer cannula placement was fine-tuned and Melissa Melissa [name] position was then confirmed with bi-planar imaging.    Test stimulation was done both at sensory and  motor levels to ensure there was no radicular stimulation. Melissa Melissa [name] target tissues were then infiltrated with 1 ml of 1% Lidocaine without Epinephrine. Subsequently, a percutaneous neurotomy was carried out for 60 seconds at 80 degrees Celsius. Melissa Melissa [name] procedure was repeated with Melissa Melissa [name] cannula rotated 90 degrees, for duration of 60 seconds,  one additional time at each level for a total of two lesions per level.  After Melissa Melissa [name] completion of Melissa Melissa [name] two lesions, 1 ml of injectate was delivered. It was then repeated for each facet joint nerve mentioned above. Appropriate radiographs were obtained to verify Melissa Melissa [name] probe placement during Melissa Melissa [name] neurotomy.   Additional Comments:  Melissa Melissa [name] Melissa [name] tolerated Melissa Melissa [name] procedure well Dressing: Band-Aid    Post-procedure details: Melissa [name] was observed during Melissa Melissa [name] procedure. Post-procedure instructions were reviewed.  Melissa Melissa [name] Melissa [name].      Clinical History: No specialty comments available.  She reports that she has never smoked. She has never used smokeless tobacco. No results for input(s): HGBA1C, LABURIC in Melissa Melissa [name] last 8760 hours.  Objective:  VS:  HT:    WT:   BMI:     BP:127/77  HR:71bpm  TEMP: ( )  RESP:98 % Physical Exam  Ortho Exam Imaging: No results found.  Past Medical/Family/Surgical/Social History: Medications & Allergies reviewed per EMR Melissa [name] Active Problem List   Diagnosis Date Noted  . Anxiety state, unspecified 06/17/2013  . Routine general medical examination at a health care facility 11/15/2011  . Dysphagia 11/09/2010  . DIARRHEA 11/10/2009  . KNEE PAIN, RIGHT 08/26/2009  . COUGH 12/21/2008  . TINNITUS, RIGHT 07/30/2007  . INSOMNIA 07/30/2007  . Leukocytopenia 07/29/2007  . ALLERGIC RHINITIS 07/29/2007  . BRONCHITIS 07/29/2007  . SCOLIOSIS 07/29/2007  . IRRITABLE BOWEL SYNDROME, HX OF 07/29/2007  . Osteoporosis 07/24/2007   Past Medical History:  Diagnosis Date  . Arthritis   . Fibroid aug. 2010  . Infertility    Family History  Problem Relation Age of Onset  . Parkinsonism Sister    Past Surgical History:  Procedure Laterality Date  . HAND SURGERY  2009   LEFT  . HARRINGTON RODS     AGE 47  . right hand surgery  2013   Social History   Occupational History  . Not on file.   Social History Main Topics  . Smoking status:  Never Smoker  . Smokeless tobacco: Never Used  . Alcohol use Yes     Comment: sometimes  . Drug use: No  . Sexual activity: Yes    Partners: Male    Birth control/ protection: Post-menopausal

## 2016-01-24 ENCOUNTER — Telehealth (INDEPENDENT_AMBULATORY_CARE_PROVIDER_SITE_OTHER): Payer: Self-pay

## 2016-01-31 NOTE — Telephone Encounter (Signed)
Left message for pt to call back to discuss the need for the other side RFA

## 2016-02-05 ENCOUNTER — Other Ambulatory Visit: Payer: Self-pay | Admitting: Endocrinology

## 2016-02-09 ENCOUNTER — Telehealth: Payer: Self-pay | Admitting: Endocrinology

## 2016-02-09 ENCOUNTER — Telehealth (INDEPENDENT_AMBULATORY_CARE_PROVIDER_SITE_OTHER): Payer: Self-pay | Admitting: Physical Medicine and Rehabilitation

## 2016-02-09 DIAGNOSIS — M47816 Spondylosis without myelopathy or radiculopathy, lumbar region: Secondary | ICD-10-CM

## 2016-02-09 MED ORDER — CELECOXIB 200 MG PO CAPS: 200.0000 mg | ORAL_CAPSULE | Freq: Every day | ORAL | 2 refills | Status: DC

## 2016-02-09 NOTE — Telephone Encounter (Signed)
Patient notified

## 2016-02-09 NOTE — Telephone Encounter (Signed)
Patient has question about medication   venlafaxine XR (EFFEXOR-XR) 75 MG 24 hr capsule  Whether or not you want her to continue taking this med. Please advise

## 2016-02-09 NOTE — Telephone Encounter (Signed)
Refilled to pharmacy on record for 200mg  po daily

## 2016-02-10 NOTE — Telephone Encounter (Signed)
See message and please advise, Thanks!  

## 2016-02-10 NOTE — Telephone Encounter (Signed)
I contacted the patient and advised of message via voicemail. Requested a call back from the patient to schedule her appointment.

## 2016-02-10 NOTE — Telephone Encounter (Signed)
Pt has not been seen recently.  Ov would be needed to consider

## 2016-02-22 ENCOUNTER — Ambulatory Visit (INDEPENDENT_AMBULATORY_CARE_PROVIDER_SITE_OTHER): Payer: BLUE CROSS/BLUE SHIELD | Admitting: Orthopedic Surgery

## 2016-02-22 ENCOUNTER — Encounter (INDEPENDENT_AMBULATORY_CARE_PROVIDER_SITE_OTHER): Payer: Self-pay | Admitting: Orthopedic Surgery

## 2016-02-22 ENCOUNTER — Ambulatory Visit (INDEPENDENT_AMBULATORY_CARE_PROVIDER_SITE_OTHER): Payer: Self-pay

## 2016-02-22 DIAGNOSIS — M1711 Unilateral primary osteoarthritis, right knee: Secondary | ICD-10-CM

## 2016-02-22 NOTE — Progress Notes (Signed)
Office Visit Note   Patient: Melissa [name]           Date of Birth: [birth year]           MRN: [id number] Visit Date: 02/22/2016 Requested by: Renato Shin, MD [address] [place], [place] [address] PCP: Renato Shin, MD  Subjective: Chief Complaint  Patient presents with  . Right Knee - Pain    HPI [name] is a 60 year old patient with right knee pain.  Has a known history of right knee arthritis.  The pain is worsening and she "can't stand it anymore".  No family history of DVT or pulmonary embolism.  She reports global pain worse on the medial side.  Patient states it keeps "sliding" which makes her hurt more and also increases her back pain.  The pain will wake her at night.  Does not have a history of osteoporosis or osteopenia.  Taking Celebrex and Tylenol.  She is moving to [place] next year              Review of Systems All systems reviewed are negative as they relate to the chief complaint within the history of present illness.  Patient denies  fevers or chills.    Assessment & Plan: Visit Diagnoses:  1. Primary osteoarthritis of right knee     Plan: Impression is right knee arthritis plan right total knee replacement risks and benefits discussed including not limited to infection or vessel damage knee stiffness incomplete pain relief.  All questions answered.  Based on bone quality may use cemented or press-fit  Follow-Up Instructions: No Follow-up on file.   Orders:  Orders Placed This Encounter  Procedures  . XR Knee 1-2 Views Right   No orders of the defined types were placed in this encounter.     Procedures: No procedures performed   Clinical Data: No additional findings.  Objective: Vital Signs: LMP 12/18/2003   Physical Exam   Constitutional: Patient appears well-developed HEENT:  Head: Normocephalic Eyes:EOM are normal Neck: Normal range of motion Cardiovascular: Normal rate Pulmonary/chest: Effort  normal Neurologic: Patient is alert Skin: Skin is warm Psychiatric: Patient has normal mood and affect    Ortho Exam examination of the right leg demonstrates good range of motion with full extension and flexion to about 125.  Collateral patient's are stable extensor mechanism is intact pedal pulses palpable no groin pain with internal/external rotation of the leg no other masses lymph adenopathy or skin changes noted in the right knee region  Specialty Comments:  No specialty comments available.  Imaging: No results found.   PMFS History: Patient Active Problem List   Diagnosis Date Noted  . Anxiety state, unspecified 06/17/2013  . Routine general medical examination at a health care facility 11/15/2011  . Dysphagia 11/09/2010  . DIARRHEA 11/10/2009  . KNEE PAIN, RIGHT 08/26/2009  . COUGH 12/21/2008  . TINNITUS, RIGHT 07/30/2007  . INSOMNIA 07/30/2007  . Leukocytopenia 07/29/2007  . ALLERGIC RHINITIS 07/29/2007  . BRONCHITIS 07/29/2007  . SCOLIOSIS 07/29/2007  . IRRITABLE BOWEL SYNDROME, HX OF 07/29/2007  . Osteoporosis 07/24/2007   Past Medical History:  Diagnosis Date  . Arthritis   . Fibroid aug. 2010  . Infertility     Family History  Problem Relation Age of Onset  . Parkinsonism Sister     Past Surgical History:  Procedure Laterality Date  . HAND SURGERY  2009   LEFT  . HARRINGTON RODS     AGE 49  .  right hand surgery  2013   Social History   Occupational History  . Not on file.   Social History Main Topics  . Smoking status: Never Smoker  . Smokeless tobacco: Never Used  . Alcohol use Yes     Comment: sometimes  . Drug use: No  . Sexual activity: Yes    Partners: Male    Birth control/ protection: Post-menopausal

## 2016-02-23 ENCOUNTER — Ambulatory Visit (INDEPENDENT_AMBULATORY_CARE_PROVIDER_SITE_OTHER): Payer: BLUE CROSS/BLUE SHIELD | Admitting: Physical Medicine and Rehabilitation

## 2016-02-23 ENCOUNTER — Encounter (INDEPENDENT_AMBULATORY_CARE_PROVIDER_SITE_OTHER): Payer: Self-pay | Admitting: Physical Medicine and Rehabilitation

## 2016-02-23 VITALS — BP 121/78 | HR 72

## 2016-02-23 DIAGNOSIS — M47816 Spondylosis without myelopathy or radiculopathy, lumbar region: Secondary | ICD-10-CM | POA: Insufficient documentation

## 2016-02-23 MED ORDER — LIDOCAINE HCL (PF) 1 % IJ SOLN: 0.3300 mL | Freq: Once | INTRAMUSCULAR | Status: DC

## 2016-02-23 MED ORDER — BUPIVACAINE HCL 0.25 % IJ SOLN: 5.0000 mL | Freq: Once | INTRAMUSCULAR | Status: DC

## 2016-02-23 NOTE — Patient Instructions (Signed)

## 2016-02-23 NOTE — Progress Notes (Signed)
Melissa [name] - 60 y.o. female MRN [id number]  Date of birth: [birth year]  Office Visit Note: Visit Date: 02/23/2016 PCP: Renato Shin, MD Referred by: Renato Shin, MD  Subjective: Chief Complaint  Patient presents with  . Lower Back - Pain   HPI: Melissa [name] is a 60 year old female who we completed left-sided radiofrequency ablation of the L3-4 and L4-5 facet joint several weeks ago Melissa [name] is actually got decent relief. Patient is here today for planned right side radiofrequency ablation. Melissa [name] also reports having a lot of right knee pain also so her balance is off which Melissa [name] thinks is making her back hurt worse. In the process of getting scheduled for knee replacement surgery with Dr. Marlou Sa.    ROS Otherwise per HPI.  Assessment & Plan: Visit Diagnoses:  1. Spondylosis without myelopathy or radiculopathy, lumbar region     Plan: Findings:  Radiofrequency ablation of the right L2 and L3 and L4 medial branches to denervate the right L3 S4 and L4-5 facet joints. Please see our prior evaluation and management note for further details and justification. Patient has chronic axial low back pain Melissa [name] has prior lumbar longstem fusion for scoliosis down to L2. Melissa [name] had double diagnostic blocks which were diagnostic and documented. Melissa [name]'s had physical therapy Melissa [name]'s had medication management. Melissa [name] is now had good relief with left-sided ablation.    Meds & Orders:  Meds ordered this encounter  Medications  . lidocaine (PF) (XYLOCAINE) 1 % injection 0.3 mL  . bupivacaine (MARCAINE) 0.25 % (with pres) injection 5 mL    Orders Placed This Encounter  Procedures  . Radiofrequency,Lumbar    Follow-up: Return in about 4 weeks (around 03/22/2016).   Procedures: No procedures performed  Lumbar Facet Joint Nerve Denervation  Melissa [name]      Date of Birth: 1955/03/14 MRN: [id number] PCP: Renato Shin, MD      Visit Date: 02/23/2016   Universal Protocol:    Date/Time:  12/22/175:28 AM  Consent Given By: the patient  Position: PRONE  Additional Comments: Vital signs were monitored before and after the procedure. Patient was prepped and draped in the usual sterile fashion. The correct patient, procedure, and site was verified.   Injection Procedure Details:  Procedure Site One Meds Administered:  Meds ordered this encounter  Medications  . lidocaine (PF) (XYLOCAINE) 1 % injection 0.3 mL  . bupivacaine (MARCAINE) 0.25 % (with pres) injection 5 mL     Laterality: Right  Location/Site:  L3-L4 L4-L5  Needle size: 18 G  Needle type: Radiofrequency cannula  Needle Placement: Along juncture of superior articular process and transverse pocess  Findings:  -Comments:  Procedure Details: For each desired target nerve, the corresponding transverse process (sacral ala for the L5 dorsal rami) was identified and the fluoroscope was positioned to square off the endplates of the corresponding vertebral body to achieve a true AP midline view.  The beam was then obliqued 15 to 20 degrees and caudally tilted 15 to 20 degrees to line up a trajectory along the target nerves. The skin over the target of the junction of superior articulating process and transverse process (sacral ala for the L5 dorsal rami) was infiltrated with 29ml of 1% Lidocaine without Epinephrine.  The 18 gauge 67mm active tip outer cannula was advanced in trajectory view to the target.  This procedure was repeated for each target nerve.  Then, for all levels, the outer cannula placement was fine-tuned and the position was then confirmed with  bi-planar imaging.    Test stimulation was done both at sensory and motor levels to ensure there was no radicular stimulation. The target tissues were then infiltrated with 1 ml of 1% Lidocaine without Epinephrine. Subsequently, a percutaneous neurotomy was carried out for 60 seconds at 80 degrees Celsius. The procedure was repeated with the cannula rotated  90 degrees, for duration of 60 seconds, one additional time at each level for a total of two lesions per level.  After the completion of the two lesions, 1 ml of injectate was delivered. It was then repeated for each facet joint nerve mentioned above. Appropriate radiographs were obtained to verify the probe placement during the neurotomy.   Additional Comments:  The patient tolerated the procedure well No complications occurred Dressing: Band-Aid and 2x2 sterile gauze     Post-procedure details: Patient was observed during the procedure. Post-procedure instructions were reviewed.  Patient left the clinic in stable condition.        Clinical History: No specialty comments available.  Melissa [name] reports that Melissa [name] has never smoked. Melissa [name] has never used smokeless tobacco. No results for input(s): HGBA1C, LABURIC in the last 8760 hours.  Objective:  VS:  HT:    WT:   BMI:     BP:121/78  HR:72bpm  TEMP: ( )  RESP:98 % Physical Exam  Musculoskeletal:  Melissa [name] ambulates without aid. Melissa [name] has some thoracolumbar scoliosis. Melissa [name] does have pain with extension.    Ortho Exam Imaging: No results found.  Past Medical/Family/Surgical/Social History: Medications & Allergies reviewed per EMR Patient Active Problem List   Diagnosis Date Noted  . Spondylosis without myelopathy or radiculopathy, lumbar region 02/23/2016  . Anxiety state, unspecified 06/17/2013  . Routine general medical examination at a health care facility 11/15/2011  . Dysphagia 11/09/2010  . DIARRHEA 11/10/2009  . KNEE PAIN, RIGHT 08/26/2009  . COUGH 12/21/2008  . TINNITUS, RIGHT 07/30/2007  . INSOMNIA 07/30/2007  . Leukocytopenia 07/29/2007  . ALLERGIC RHINITIS 07/29/2007  . BRONCHITIS 07/29/2007  . SCOLIOSIS 07/29/2007  . IRRITABLE BOWEL SYNDROME, HX OF 07/29/2007  . Osteoporosis 07/24/2007   Past Medical History:  Diagnosis Date  . Arthritis   . Fibroid aug. 2010  . Infertility    Family History  Problem Relation  Age of Onset  . Parkinsonism Sister    Past Surgical History:  Procedure Laterality Date  . HAND SURGERY  2009   LEFT  . HARRINGTON RODS     AGE 41  . right hand surgery  2013   Social History   Occupational History  . Not on file.   Social History Main Topics  . Smoking status: Never Smoker  . Smokeless tobacco: Never Used  . Alcohol use Yes     Comment: sometimes  . Drug use: No  . Sexual activity: Yes    Partners: Male    Birth control/ protection: Post-menopausal

## 2016-02-24 NOTE — Procedures (Signed)
Lumbar Facet Joint Nerve Denervation  Patient: Melissa [name]      Date of Birth: 19-Jun-1955 MRN: [id number] PCP: Renato Shin, MD      Visit Date: 02/23/2016   Universal Protocol:    Date/Time: 12/22/175:28 AM  Consent Given By: the patient  Position: PRONE  Additional Comments: Vital signs were monitored before and after the procedure. Patient was prepped and draped in the usual sterile fashion. The correct patient, procedure, and site was verified.   Injection Procedure Details:  Procedure Site One Meds Administered:  Meds ordered this encounter  Medications  . lidocaine (PF) (XYLOCAINE) 1 % injection 0.3 mL  . bupivacaine (MARCAINE) 0.25 % (with pres) injection 5 mL     Laterality: Right  Location/Site:  L3-L4 L4-L5  Needle size: 18 G  Needle type: Radiofrequency cannula  Needle Placement: Along juncture of superior articular process and transverse pocess  Findings:  -Comments:  Procedure Details: For each desired target nerve, the corresponding transverse process (sacral ala for the L5 dorsal rami) was identified and the fluoroscope was positioned to square off the endplates of the corresponding vertebral body to achieve a true AP midline view.  The beam was then obliqued 15 to 20 degrees and caudally tilted 15 to 20 degrees to line up a trajectory along the target nerves. The skin over the target of the junction of superior articulating process and transverse process (sacral ala for the L5 dorsal rami) was infiltrated with 70ml of 1% Lidocaine without Epinephrine.  The 18 gauge 56mm active tip outer cannula was advanced in trajectory view to the target.  This procedure was repeated for each target nerve.  Then, for all levels, the outer cannula placement was fine-tuned and the position was then confirmed with bi-planar imaging.    Test stimulation was done both at sensory and motor levels to ensure there was no radicular stimulation. The target tissues were  then infiltrated with 1 ml of 1% Lidocaine without Epinephrine. Subsequently, a percutaneous neurotomy was carried out for 60 seconds at 80 degrees Celsius. The procedure was repeated with the cannula rotated 90 degrees, for duration of 60 seconds, one additional time at each level for a total of two lesions per level.  After the completion of the two lesions, 1 ml of injectate was delivered. It was then repeated for each facet joint nerve mentioned above. Appropriate radiographs were obtained to verify the probe placement during the neurotomy.   Additional Comments:  The patient tolerated the procedure well No complications occurred Dressing: Band-Aid and 2x2 sterile gauze     Post-procedure details: Patient was observed during the procedure. Post-procedure instructions were reviewed.  Patient left the clinic in stable condition.

## 2016-03-06 ENCOUNTER — Other Ambulatory Visit: Payer: Self-pay | Admitting: Endocrinology

## 2016-03-07 ENCOUNTER — Telehealth: Payer: Self-pay | Admitting: Endocrinology

## 2016-03-07 NOTE — Telephone Encounter (Signed)
please call patient: I refilled ambien x 1, pending ov

## 2016-03-07 NOTE — Telephone Encounter (Signed)
I contacted the patient and advised of message via voicemail. Requested a call back from the patient to schedule an appointment.

## 2016-03-11 NOTE — Progress Notes (Signed)
Subjective:    Patient ID: Melissa [name], female    DOB: 07/19/1955, 60 y.o.   MRN: [id number]  HPI Pt is here for regular wellness examination, and is feeling pretty well in general, and says chronic med probs are stable, except as noted below.   Past Medical History:  Diagnosis Date  . Arthritis   . Fibroid aug. 2010  . Infertility     Past Surgical History:  Procedure Laterality Date  . HAND SURGERY  2009   LEFT  . HARRINGTON RODS     AGE 44  . right hand surgery  2013    Social History   Social History  . Marital status: Married    Spouse name: N/A  . Number of children: N/A  . Years of education: N/A   Occupational History  . Not on file.   Social History Main Topics  . Smoking status: Never Smoker  . Smokeless tobacco: Never Used  . Alcohol use Yes     Comment: sometimes  . Drug use: No  . Sexual activity: Yes    Partners: Male    Birth control/ protection: Post-menopausal   Other Topics Concern  . Not on file   Social History Narrative   Pt has about 3 caffeine drinks daily ( tea)    Current Outpatient Prescriptions on File Prior to Visit  Medication Sig Dispense Refill  . Ascorbic Acid (VITAMIN C) 1000 MG tablet Take 1,000 mg by mouth daily.      . Azelastine HCl (ASTEPRO) 0.15 % SOLN Place into the nose as directed.     . Calcium Carbonate-Vit D-Min (CALCIUM 1200) 1200-1000 MG-UNIT CHEW Chew 1 tablet by mouth daily.      . celecoxib (CELEBREX) 200 MG capsule Take 1 capsule (200 mg total) by mouth daily. 30 capsule 2  . cetirizine (ZYRTEC) 10 MG tablet Take 10 mg by mouth daily.      . cholecalciferol (VITAMIN D) 400 UNITS TABS Take 400 Units by mouth daily.      . Glucosamine-Chondroitin (GLUCOSAMINE CHONDR COMPLEX PO) Take 1 tablet by mouth daily.     Marland Kitchen levocetirizine (XYZAL) 5 MG tablet Take 5 mg by mouth every evening.  5  . montelukast (SINGULAIR) 10 MG tablet Take 10 mg by mouth every evening.  5  . Multiple Vitamin (MULTIVITAMIN)  tablet Take 1 tablet by mouth daily.      . Omega-3 Fatty Acids (FISH OIL PO) Take 1 tablet by mouth daily.     . valACYclovir (VALTREX) 500 MG tablet Take one tablet twice daily for 3-5 days the prn as needed 30 tablet 12  . VITAMIN E PO Take 1 tablet by mouth daily.     Marland Kitchen zolpidem (AMBIEN) 5 MG tablet TAKE 1 TABLET BY MOUTH AT BEDTIME AS NEEDED FOR SLEEP 30 tablet 0  . fluticasone (FLONASE) 50 MCG/ACT nasal spray Place 2 sprays into both nostrils daily. 16 g 11  . venlafaxine XR (EFFEXOR-XR) 75 MG 24 hr capsule TAKE 2 CAPSULES BY MOUTH EVERY DAY (Patient not taking: Reported on 03/12/2016) 180 capsule 1   Current Facility-Administered Medications on File Prior to Visit  Medication Dose Route Frequency Provider Last Rate Last Dose  . bupivacaine (MARCAINE) 0.25 % (with pres) injection 5 mL  5 mL Other Once Magnus Sinning, MD      . lidocaine (PF) (XYLOCAINE) 1 % injection 0.3 mL  0.3 mL Other Once Magnus Sinning, MD      .  lidocaine (PF) (XYLOCAINE) 1 % injection 0.3 mL  0.3 mL Other Once Magnus Sinning, MD        Allergies  Allergen Reactions  . Ampicillin     REACTION: Rash    Family History  Problem Relation Age of Onset  . Parkinsonism Sister    BP 122/80   Pulse 67   Ht 5\' 6"  (1.676 m)   Wt 164 lb (74.4 kg)   LMP 12/18/2003   SpO2 98%   BMI 26.47 kg/m   Review of Systems Constitutional: Negative for fever.  She has gained weight.  HENT: Negative for hearing loss.   Eyes: Negative for visual disturbance.  Respiratory: Negative for shortness of breath.   Cardiovascular: Negative for chest pain.  Gastrointestinal: Negative for anal bleeding.  GERD has recurred, and intermittent dysphagia.   Endocrine: Negative for cold intolerance.  Genitourinary: Negative for hematuria.  Musculoskeletal: Positive for chronic back pain (sees pain mgmt).  Skin: Negative for rash.  Allergic/Immunologic: Positive for environmental allergies.  Neurological: Negative for syncope and  numbness.  Hematological: positive for bruising/bleeding easily.   Psychiatric/Behavioral: Negative for dysphoric mood.  insomnia is well-controlled on intermittent ambien.     Objective:   Physical Exam VS: see vs page GEN: no distress HEAD: head: no deformity eyes: no periorbital swelling, no proptosis external nose and ears are normal mouth: no lesion seen NECK: supple, thyroid is not enlarged CHEST WALL: chronic deformity is again noted LUNGS:  Clear to auscultation BREASTS: sees gyn.   CV: reg rate and rhythm, no murmur ABD: abdomen is soft, nontender.  no hepatosplenomegaly.  not distended.  no hernia.   GENITALIA/RECTAL: sees gyn.   MUSCULOSKELETAL: muscle bulk and strength are grossly normal.  no obvious joint swelling.  gait is normal and steady.  Scoliosis is again noted.  EXTEMITIES: no deformity, except for a few hammer toes.  no ulcer on the feet.  feet are of normal color and temp.  no edema.  PULSES: dorsalis pedis intact bilat.  no carotid bruit NEURO:  cn 2-12 grossly intact.   readily moves all 4's.  sensation is intact to touch on the feet SKIN:  Normal texture and temperature.  No rash or suspicious lesion is visible.   NODES:  None palpable at the neck.   PSYCH: alert, well-oriented.  Does not appear anxious nor depressed.  I personally reviewed electrocardiogram tracing (today): Indication: wellness. Impression: normal.      Assessment & Plan:  Wellness visit today, with problems stable, except as noted.  Patient is advised the following: Patient Instructions  Please consider these measures for your health:  minimize alcohol.  Do not use tobacco products.  Have a colonoscopy at least every 10 years from age 22.  Women should have an annual mammogram from age 60.  Keep firearms safely stored.  Always use seat belts.  have working smoke alarms in your home.  See an eye doctor and dentist regularly.  Never drive under the influence of alcohol or drugs (including  prescription drugs).  Those with fair skin should take precautions against the sun, and should carefully examine their skin once per month, for any new or changed moles.   It is critically important to prevent falling down (keep floor areas well-lit, dry, and free of loose objects.  If you have a cane, walker, or wheelchair, you should use it, even for short trips around the house.  Wear flat-soled shoes.  Also, try not to rush).  blood tests are requested for you today.  We'll let you know about the results.   Please see a specialist for the heartburn.  you will receive a phone call, about a day and time for an appointment Please return in 1 year.

## 2016-03-12 ENCOUNTER — Encounter: Payer: Self-pay | Admitting: Endocrinology

## 2016-03-12 ENCOUNTER — Ambulatory Visit (INDEPENDENT_AMBULATORY_CARE_PROVIDER_SITE_OTHER): Payer: BLUE CROSS/BLUE SHIELD | Admitting: Endocrinology

## 2016-03-12 VITALS — BP 122/80 | HR 67 | Ht 66.0 in | Wt 164.0 lb

## 2016-03-12 DIAGNOSIS — K219 Gastro-esophageal reflux disease without esophagitis: Secondary | ICD-10-CM | POA: Diagnosis not present

## 2016-03-12 DIAGNOSIS — Z0000 Encounter for general adult medical examination without abnormal findings: Secondary | ICD-10-CM

## 2016-03-12 LAB — LIPID PANEL
CHOL/HDL RATIO: 3
Cholesterol: 160 mg/dL (ref 0–200)
HDL: 59.3 mg/dL (ref >39.00)
LDL CALC: 83 mg/dL (ref 0–99)
NONHDL: 100.38
Triglycerides: 87 mg/dL (ref 0.0–149.0)
VLDL: 17.4 mg/dL (ref 0.0–40.0)

## 2016-03-12 LAB — URINALYSIS, ROUTINE W REFLEX MICROSCOPIC
BILIRUBIN URINE: NEGATIVE
Hgb urine dipstick: NEGATIVE
KETONES UR: NEGATIVE
LEUKOCYTES UA: NEGATIVE
Nitrite: NEGATIVE
RBC / HPF: NONE SEEN (ref 0–?)
Specific Gravity, Urine: <=1.005 — AB (ref 1.000–1.030)
TOTAL PROTEIN, URINE-UPE24: NEGATIVE
UROBILINOGEN UA: 0.2 (ref 0.0–1.0)
Urine Glucose: NEGATIVE
WBC, UA: NONE SEEN (ref 0–?)
pH: 6 (ref 5.0–8.0)

## 2016-03-12 LAB — CBC WITH DIFFERENTIAL/PLATELET
EOS PCT: 0.8 % (ref 0.0–5.0)
HCT: 39.1 % (ref 36.0–46.0)
Hemoglobin: 13.4 g/dL (ref 12.0–15.0)
LYMPHS PCT: 37.1 % (ref 12.0–46.0)
MCHC: 34.3 g/dL (ref 30.0–36.0)
MCV: 91.1 fl (ref 78.0–100.0)
MONOS PCT: 26.8 % — AB (ref 3.0–12.0)
NEUTROS PCT: 34.9 % — AB (ref 43.0–77.0)
PLATELETS: 194 10*3/uL (ref 150.0–400.0)
RBC: 4.29 Mil/uL (ref 3.87–5.11)
RDW: 13 % (ref 11.5–15.5)
WBC: 4 10*3/uL (ref 4.0–10.5)

## 2016-03-12 LAB — HEPATIC FUNCTION PANEL
ALBUMIN: 4 g/dL (ref 3.5–5.2)
ALT: 25 U/L (ref 0–35)
AST: 26 U/L (ref 0–37)
Alkaline Phosphatase: 75 U/L (ref 39–117)
Bilirubin, Direct: 0.1 mg/dL (ref 0.0–0.3)
Total Bilirubin: 0.3 mg/dL (ref 0.2–1.2)
Total Protein: 6.6 g/dL (ref 6.0–8.3)

## 2016-03-12 LAB — BASIC METABOLIC PANEL
BUN: 13 mg/dL (ref 6–23)
CHLORIDE: 101 meq/L (ref 96–112)
CO2: 31 mEq/L (ref 19–32)
CREATININE: 0.75 mg/dL (ref 0.40–1.20)
Calcium: 9.3 mg/dL (ref 8.4–10.5)
GFR: 83.36 mL/min (ref >60.00)
Glucose, Bld: 97 mg/dL (ref 70–99)
POTASSIUM: 4.5 meq/L (ref 3.5–5.1)
Sodium: 138 mEq/L (ref 135–145)

## 2016-03-12 LAB — TSH: TSH: 1.15 u[IU]/mL (ref 0.35–4.50)

## 2016-03-12 NOTE — Progress Notes (Signed)
we discussed code status.  pt requests full code, but would not want to be started or maintained on artificial life-support measures if there was not a reasonable chance of recovery 

## 2016-03-12 NOTE — Patient Instructions (Addendum)
Please consider these measures for your health:  minimize alcohol.  Do not use tobacco products.  Have a colonoscopy at least every 10 years from age 62.  Women should have an annual mammogram from age 71.  Keep firearms safely stored.  Always use seat belts.  have working smoke alarms in your home.  See an eye doctor and dentist regularly.  Never drive under the influence of alcohol or drugs (including prescription drugs).  Those with fair skin should take precautions against the sun, and should carefully examine their skin once per month, for any new or changed moles.   It is critically important to prevent falling down (keep floor areas well-lit, dry, and free of loose objects.  If you have a cane, walker, or wheelchair, you should use it, even for short trips around the house.  Wear flat-soled shoes.  Also, try not to rush).    blood tests are requested for you today.  We'll let you know about the results.   Please see a specialist for the heartburn.  you will receive a phone call, about a day and time for an appointment Please return in 1 year.

## 2016-03-13 ENCOUNTER — Encounter: Payer: Self-pay | Admitting: Gastroenterology

## 2016-03-13 LAB — HIV ANTIBODY (ROUTINE TESTING W REFLEX): HIV 1&2 Ab, 4th Generation: NONREACTIVE

## 2016-03-13 LAB — HEPATITIS C ANTIBODY: HCV AB: NEGATIVE

## 2016-03-22 ENCOUNTER — Ambulatory Visit (INDEPENDENT_AMBULATORY_CARE_PROVIDER_SITE_OTHER): Payer: BLUE CROSS/BLUE SHIELD | Admitting: Physical Medicine and Rehabilitation

## 2016-03-28 ENCOUNTER — Ambulatory Visit (INDEPENDENT_AMBULATORY_CARE_PROVIDER_SITE_OTHER): Payer: BLUE CROSS/BLUE SHIELD | Admitting: Physical Medicine and Rehabilitation

## 2016-03-28 ENCOUNTER — Encounter (INDEPENDENT_AMBULATORY_CARE_PROVIDER_SITE_OTHER): Payer: Self-pay | Admitting: Physical Medicine and Rehabilitation

## 2016-03-28 VITALS — BP 139/86 | HR 67

## 2016-03-28 DIAGNOSIS — G8929 Other chronic pain: Secondary | ICD-10-CM | POA: Diagnosis not present

## 2016-03-28 DIAGNOSIS — M47816 Spondylosis without myelopathy or radiculopathy, lumbar region: Secondary | ICD-10-CM | POA: Diagnosis not present

## 2016-03-28 DIAGNOSIS — M545 Low back pain: Secondary | ICD-10-CM | POA: Diagnosis not present

## 2016-03-28 NOTE — Progress Notes (Signed)
Melissa [name] - 61 y.o. female MRN [id number]  Date of birth: [birth year]  Office Visit Note: Visit Date: 03/28/2016 PCP: Renato Shin, MD Referred by: Renato Shin, MD  Subjective: Chief Complaint  Patient presents with  . Lower Back - Pain   HPI: Melissa [name] is a 61 year old female that we've completed bilateral radiofrequency ablation of the L3-4 and L4-5 facet joints approximately 4 weeks ago. She returns today with very successful outcome. She is very happy that the sharp pain in the middle of her bac is essentially gone at this point is under percent pain-free from that. She still feels some tightness and soreness at times which is more of a chronic ache that she can deal with. She is going to massage therapy for this and feels like it's more muscle pain than what she was having before. Overall she's pretty pleased with the outcome. She is scheduled to have a knee replacement coming up central facial well with that.    Better since RFA. Intense ache is gone. Has "muscle pain" which she is working on with massage and exercise. Doing much better. Review of Systems  Constitutional: Negative for chills and fever.  HENT: Negative for hearing loss.   Eyes: Negative for blurred vision, double vision and photophobia.  Respiratory: Negative for cough and shortness of breath.   Cardiovascular: Negative for chest pain and palpitations.  Gastrointestinal: Negative for abdominal pain, nausea and vomiting.  Genitourinary: Negative for flank pain.  Musculoskeletal: Positive for back pain. Negative for myalgias.  Skin: Negative for itching and rash.  Neurological: Negative for tremors, focal weakness and weakness.  Endo/Heme/Allergies: Negative.   Psychiatric/Behavioral: Negative for depression.  All other systems reviewed and are negative.  Otherwise per HPI.  Assessment & Plan: Visit Diagnoses:  1. Spondylosis without myelopathy or radiculopathy, lumbar region   2. Chronic  bilateral low back pain without sciatica     Plan: Findings:  Chronic history of low back pain which was facet mediated pain diagnosis with double block paradigm of the L3"for L4-5 facet joints. We completed radiofrequency ablation of the same joints and she's done exceedingly well with the listener percent relief of the midline low back pain. Still having some muscle aches and pain and soreness but nothing she can do with that she's pretty excited about it. She is scheduled for knee replacement as well coming up. She'll see [place] back as needed.    Meds & Orders: No orders of the defined types were placed in this encounter.  No orders of the defined types were placed in this encounter.   Follow-up: Return if symptoms worsen or fail to improve.   Procedures: No procedures performed  No notes on file   Clinical History: No specialty comments available.  She reports that she has never smoked. She has never used smokeless tobacco. No results for input(s): HGBA1C, LABURIC in the last 8760 hours.  Objective:  VS:  HT:    WT:   BMI:     BP:139/86  HR:67bpm  TEMP: ( )  RESP:  Physical Exam  Constitutional: She is oriented to person, place, and time. She appears well-developed and well-nourished.  Eyes: Conjunctivae and EOM are normal. Pupils are equal, round, and reactive to light.  Cardiovascular: Intact distal pulses.   Pulmonary/Chest: Effort normal.  Musculoskeletal:  Lumbar spine range of motion is still painful with extension but dramatically decreased. She has good distal strength.  Neurological: She is alert and oriented to person, place, and  time. She exhibits normal muscle tone.  Skin: Skin is warm and dry. No rash noted. No erythema.  Psychiatric: She has a normal mood and affect. Her behavior is normal.  Nursing note and vitals reviewed.   Ortho Exam Imaging: No results found.  Past Medical/Family/Surgical/Social History: Medications & Allergies reviewed per EMR Patient  Active Problem List   Diagnosis Date Noted  . GERD (gastroesophageal reflux disease) 03/12/2016  . Spondylosis without myelopathy or radiculopathy, lumbar region 02/23/2016  . Anxiety state, unspecified 06/17/2013  . Routine general medical examination at a health care facility 11/15/2011  . Dysphagia 11/09/2010  . DIARRHEA 11/10/2009  . KNEE PAIN, RIGHT 08/26/2009  . COUGH 12/21/2008  . TINNITUS, RIGHT 07/30/2007  . INSOMNIA 07/30/2007  . Leukocytopenia 07/29/2007  . ALLERGIC RHINITIS 07/29/2007  . BRONCHITIS 07/29/2007  . SCOLIOSIS 07/29/2007  . IRRITABLE BOWEL SYNDROME, HX OF 07/29/2007  . Osteoporosis 07/24/2007   Past Medical History:  Diagnosis Date  . Arthritis   . Fibroid aug. 2010  . Infertility    Family History  Problem Relation Age of Onset  . Parkinsonism Sister    Past Surgical History:  Procedure Laterality Date  . HAND SURGERY  2009   LEFT  . HARRINGTON RODS     AGE 89  . right hand surgery  2013   Social History   Occupational History  . Not on file.   Social History Main Topics  . Smoking status: Never Smoker  . Smokeless tobacco: Never Used  . Alcohol use Yes     Comment: sometimes  . Drug use: No  . Sexual activity: Yes    Partners: Male    Birth control/ protection: Post-menopausal

## 2016-04-03 ENCOUNTER — Telehealth (INDEPENDENT_AMBULATORY_CARE_PROVIDER_SITE_OTHER): Payer: Self-pay | Admitting: Orthopedic Surgery

## 2016-04-03 ENCOUNTER — Encounter: Payer: Self-pay | Admitting: Women's Health

## 2016-04-03 ENCOUNTER — Ambulatory Visit (INDEPENDENT_AMBULATORY_CARE_PROVIDER_SITE_OTHER): Payer: BLUE CROSS/BLUE SHIELD | Admitting: Women's Health

## 2016-04-03 VITALS — BP 138/88 | Ht 66.0 in | Wt 159.0 lb

## 2016-04-03 DIAGNOSIS — Z01419 Encounter for gynecological examination (general) (routine) without abnormal findings: Secondary | ICD-10-CM | POA: Diagnosis not present

## 2016-04-03 NOTE — Progress Notes (Signed)
NEARIAH LUYSTER 04-04-1955 [id number]    History:    Presents for annual exam.  Menopausal on no HRT with no bleeding. Normal Pap and mammogram history.  Scheduled for for knee replacement next month. Primary care monitors labs and meds and DEXA. History of anxiety/depression, insomnia, IBS, GERD. Has had Zostavax not Pneumovax.  Past medical history, past surgical history, family history and social history were all reviewed and documented in the EPIC chart. Planning to move in August to the [place]. Second retired.  ROS:  A ROS was performed and pertinent positives and negatives are included.  Exam:  Vitals:   04/03/16 1133  BP: 138/88  Weight: 159 lb (72.1 kg)  Height: 5\' 6"  (1.676 m)   Body mass index is 25.66 kg/m.   General appearance:  Normal Thyroid:  Symmetrical, normal in size, without palpable masses or nodularity. Respiratory  Auscultation:  Clear without wheezing or rhonchi Cardiovascular  Auscultation:  Regular rate, without rubs, murmurs or gallops  Edema/varicosities:  Not grossly evident Abdominal  Soft,nontender, without masses, guarding or rebound.  Liver/spleen:  No organomegaly noted  Hernia:  None appreciated  Skin  Inspection:  Grossly normal   Breasts: Examined lying and sitting.     Right: Without masses, retractions, discharge or axillary adenopathy.     Left: Without masses, retractions, discharge or axillary adenopathy. Gentitourinary   Inguinal/mons:  Normal without inguinal adenopathy  External genitalia:  Normal  BUS/Urethra/Skene's glands:  Normal  Vagina: Atrophic  Cervix:  Normal  Uterus:  normal in size, shape and contour.  Midline and mobile  Adnexa/parametria:     Rt: Without masses or tenderness.   Lt: Without masses or tenderness.  Anus and perineum: Normal  Digital rectal exam: Normal sphincter tone without palpated masses or tenderness  Assessment/Plan:  61 y.o. MWF G0  for annual exam with no complaints.  Postmenopausal/no  HRT/no bleeding last vaginal atrophy Anxiety/depression, GERD,-primary care manages labs and meds Osteoarthritis-Knee Replacement scheduled next month Osteopenia without elevated FRAX-primary care manages  Plan: SBE's, continue annual 3-D screening mammogram, calcium rich diet, vitamin D 2000 daily encouraged. Repeat DEXA as recommended, reviewed importance of home safety, fall prevention and regular weightbearing exercise, yoga encouraged. Encouraged vaginal lubricants with intercourse declines other treatment. Pap normal January 2017, new screening guidelines reviewed.  Huel Cote WHNP, 1:05 PM 04/03/2016

## 2016-04-03 NOTE — Patient Instructions (Signed)

## 2016-04-03 NOTE — Telephone Encounter (Signed)
[employer] denied pt for CPM machine. Considered not medically necessary.

## 2016-04-04 LAB — URINALYSIS W MICROSCOPIC + REFLEX CULTURE
BACTERIA UA: NONE SEEN [HPF]
Bilirubin Urine: NEGATIVE
CASTS: NONE SEEN [LPF]
CRYSTALS: NONE SEEN [HPF]
Glucose, UA: NEGATIVE
HGB URINE DIPSTICK: NEGATIVE
KETONES UR: NEGATIVE
NITRITE: NEGATIVE
PROTEIN: NEGATIVE
RBC / HPF: NONE SEEN RBC/HPF (ref <=2)
SQUAMOUS EPITHELIAL / LPF: NONE SEEN [HPF] (ref <=5)
Specific Gravity, Urine: 1.006 (ref 1.001–1.035)
WBC, UA: NONE SEEN WBC/HPF (ref <=5)
YEAST: NONE SEEN [HPF]
pH: 7.5 (ref 5.0–8.0)

## 2016-04-04 NOTE — Telephone Encounter (Signed)
Ok what action required on my part

## 2016-04-04 NOTE — Telephone Encounter (Signed)
What do we do instead of the CPM?  PT?  HEP?  Surgery is 04/24/2016

## 2016-04-04 NOTE — Telephone Encounter (Signed)
See note, pls advise 

## 2016-04-04 NOTE — Telephone Encounter (Signed)
noted 

## 2016-04-04 NOTE — Telephone Encounter (Signed)
She will have hhpt pls clal thx

## 2016-04-05 LAB — URINE CULTURE

## 2016-04-12 ENCOUNTER — Other Ambulatory Visit (INDEPENDENT_AMBULATORY_CARE_PROVIDER_SITE_OTHER): Payer: Self-pay | Admitting: Orthopedic Surgery

## 2016-04-12 DIAGNOSIS — M1711 Unilateral primary osteoarthritis, right knee: Secondary | ICD-10-CM

## 2016-04-13 NOTE — Pre-Procedure Instructions (Signed)
Melissa [name]  04/13/2016      [id number] [address], [place] [address] Phone: [phone] Fax: [phone]    Your procedure is scheduled on Tuesday February 20.  Report to [facility] Admitting at 5:30 A.M.  Call this number if you have problems the morning of surgery:  [phone]   Remember:  Do not eat food or drink liquids after midnight.  Take these medicines the morning of surgery with A SIP OF WATER: venlafaxine (Effexor)  7 days prior to surgery STOP taking any celecoxib (Celebrex), Aspirin, Aleve, Naproxen, Ibuprofen, Motrin, Advil, Goody's, BC's, all herbal medications, fish oil, and all vitamins    Do not wear jewelry, make-up or nail polish.  Do not wear lotions, powders, or perfumes, or deoderant.  Do not shave 48 hours prior to surgery.  Men may shave face and neck.  Do not bring valuables to the hospital.  [facility] is not responsible for any belongings or valuables.  Contacts, dentures or bridgework may not be worn into surgery.  Leave your suitcase in the car.  After surgery it may be brought to your room.  For patients admitted to the hospital, discharge time will be determined by your treatment team.  Patients discharged the day of surgery will not be allowed to drive home.   Special instructions:    [place]- Preparing For Surgery  Before surgery, you can play an important role. Because skin is not sterile, your skin needs to be as free of germs as possible. You can reduce the number of germs on your skin by washing with CHG (chlorahexidine gluconate) Soap before surgery.  CHG is an antiseptic cleaner which kills germs and bonds with the skin to continue killing germs even after washing.  Please do not use if you have an allergy to CHG or antibacterial soaps. If your skin becomes reddened/irritated stop using the CHG.  Do not shave  (including legs and underarms) for at least 48 hours prior to first CHG shower. It is OK to shave your face.  Please follow these instructions carefully.   1. Shower the NIGHT BEFORE SURGERY and the MORNING OF SURGERY with CHG.   2. If you chose to wash your hair, wash your hair first as usual with your normal shampoo.  3. After you shampoo, rinse your hair and body thoroughly to remove the shampoo.  4. Use CHG as you would any other liquid soap. You can apply CHG directly to the skin and wash gently with a scrungie or a clean washcloth.   5. Apply the CHG Soap to your body ONLY FROM THE NECK DOWN.  Do not use on open wounds or open sores. Avoid contact with your eyes, ears, mouth and genitals (private parts). Wash genitals (private parts) with your normal soap.  6. Wash thoroughly, paying special attention to the area where your surgery will be performed.  7. Thoroughly rinse your body with warm water from the neck down.  8. DO NOT shower/wash with your normal soap after using and rinsing off the CHG Soap.  9. Pat yourself dry with a CLEAN TOWEL.   10. Wear CLEAN PAJAMAS   11. Place CLEAN SHEETS on your bed the night of your first shower and DO NOT SLEEP WITH PETS.    Day of Surgery: Do not apply any deodorants/lotions. Please wear clean clothes to the hospital/surgery center.  Please read over the following fact sheets that you were given. Total Joint Packet and MRSA Information

## 2016-04-15 ENCOUNTER — Other Ambulatory Visit (INDEPENDENT_AMBULATORY_CARE_PROVIDER_SITE_OTHER): Payer: Self-pay | Admitting: Physical Medicine and Rehabilitation

## 2016-04-16 ENCOUNTER — Encounter (HOSPITAL_COMMUNITY)
Admission: RE | Admit: 2016-04-16 | Discharge: 2016-04-16 | Disposition: A | Discharge status: Home / Self Care | Payer: BLUE CROSS/BLUE SHIELD | Source: Ambulatory Visit | Attending: Orthopedic Surgery | Admitting: Orthopedic Surgery

## 2016-04-16 ENCOUNTER — Encounter (HOSPITAL_COMMUNITY): Payer: Self-pay

## 2016-04-16 DIAGNOSIS — Z01812 Encounter for preprocedural laboratory examination: Secondary | ICD-10-CM | POA: Diagnosis not present

## 2016-04-16 DIAGNOSIS — Z820 Family history of epilepsy and other diseases of the nervous system: Secondary | ICD-10-CM | POA: Diagnosis not present

## 2016-04-16 DIAGNOSIS — M47896 Other spondylosis, lumbar region: Secondary | ICD-10-CM | POA: Insufficient documentation

## 2016-04-16 DIAGNOSIS — K219 Gastro-esophageal reflux disease without esophagitis: Secondary | ICD-10-CM | POA: Insufficient documentation

## 2016-04-16 DIAGNOSIS — M545 Low back pain: Secondary | ICD-10-CM | POA: Diagnosis not present

## 2016-04-16 HISTORY — DX: Gastro-esophageal reflux disease without esophagitis: K21.9

## 2016-04-16 LAB — SURGICAL PCR SCREEN
MRSA, PCR: NEGATIVE
Staphylococcus aureus: NEGATIVE

## 2016-04-16 LAB — BASIC METABOLIC PANEL
Anion gap: 9 (ref 5–15)
BUN: 15 mg/dL (ref 6–20)
CO2: 28 mmol/L (ref 22–32)
Calcium: 9.8 mg/dL (ref 8.9–10.3)
Chloride: 104 mmol/L (ref 101–111)
Creatinine, Ser: 0.83 mg/dL (ref 0.44–1.00)
GFR calc Af Amer: >60 mL/min (ref >60)
GLUCOSE: 90 mg/dL (ref 65–99)
POTASSIUM: 4.5 mmol/L (ref 3.5–5.1)
Sodium: 141 mmol/L (ref 135–145)

## 2016-04-16 LAB — CBC
HCT: 44.6 % (ref 36.0–46.0)
Hemoglobin: 14.6 g/dL (ref 12.0–15.0)
MCH: 30.5 pg (ref 26.0–34.0)
MCHC: 32.7 g/dL (ref 30.0–36.0)
MCV: 93.1 fL (ref 78.0–100.0)
PLATELETS: 201 10*3/uL (ref 150–400)
RBC: 4.79 MIL/uL (ref 3.87–5.11)
RDW: 13.1 % (ref 11.5–15.5)
WBC: 5.8 10*3/uL (ref 4.0–10.5)

## 2016-04-16 NOTE — Progress Notes (Signed)
PCP: Renato Shin No cardiologist or cardiac workup per pt.   Pt denies cardiac symptoms or signs of infection at PAT appointment.  EKG: 03/12/16

## 2016-04-19 ENCOUNTER — Encounter: Payer: Self-pay | Admitting: Gastroenterology

## 2016-04-19 ENCOUNTER — Ambulatory Visit (INDEPENDENT_AMBULATORY_CARE_PROVIDER_SITE_OTHER): Payer: BLUE CROSS/BLUE SHIELD | Admitting: Gastroenterology

## 2016-04-19 VITALS — BP 122/80 | HR 76 | Ht 66.0 in | Wt 161.0 lb

## 2016-04-19 DIAGNOSIS — Z1212 Encounter for screening for malignant neoplasm of rectum: Secondary | ICD-10-CM | POA: Diagnosis not present

## 2016-04-19 DIAGNOSIS — Z1211 Encounter for screening for malignant neoplasm of colon: Secondary | ICD-10-CM | POA: Diagnosis not present

## 2016-04-19 DIAGNOSIS — R1310 Dysphagia, unspecified: Secondary | ICD-10-CM | POA: Diagnosis not present

## 2016-04-19 DIAGNOSIS — K219 Gastro-esophageal reflux disease without esophagitis: Secondary | ICD-10-CM

## 2016-04-19 MED ORDER — NA SULFATE-K SULFATE-MG SULF 17.5-3.13-1.6 GM/177ML PO SOLN: 1.0000 | Freq: Once | ORAL | 0 refills | Status: AC

## 2016-04-19 MED ORDER — OMEPRAZOLE 20 MG PO CPDR: 20.0000 mg | DELAYED_RELEASE_CAPSULE | Freq: Every day | ORAL | 11 refills | Status: AC

## 2016-04-19 NOTE — Telephone Encounter (Signed)
See rx request. 

## 2016-04-19 NOTE — Patient Instructions (Addendum)
We have sent the following medications to your pharmacy for you to pick up at your convenience:omeprazole.   You have been scheduled for an endoscopy and colonoscopy. Please follow the written instructions given to you at your visit today. Please pick up your prep supplies at the pharmacy within the next 1-3 days. If you use inhalers (even only as needed), please bring them with you on the day of your procedure. Your physician has requested that you go to www.startemmi.com and enter the access code given to you at your visit today. This web site gives a general overview about your procedure. However, you should still follow specific instructions given to you by our office regarding your preparation for the procedure.  Normal BMI (Body Mass Index- based on height and weight) is between 19 and 25. Your BMI today is Body mass index is 25.99 kg/m. Marland Kitchen Please consider follow up  regarding your BMI with your Primary Care Provider.    Thank you for choosing me and [facility].  Pricilla Riffle. Dagoberto Ligas., MD., Marval Regal

## 2016-04-19 NOTE — Progress Notes (Signed)
History of Present Illness: This is a 61 year old female referred by Renato Shin, MD for the evaluation of GERD, dysphagia, CRC screening. She has a history of intermittent reflux symptoms which have worsened over the past several months. She has had intermittent dysphagia mainly to breads for several years and these symptoms have also worsened over the past several months. She was evaluated in 2012 for similar complaints and EGD was recommended however she canceled the procedure. She takes TUMS frequently. She would like to proceed with colonoscopy for colorectal cancer screening although it is about one year before her 10 year screening date as she plans to relocate and retire later this year. She is scheduled for a right total knee replacement next Tuesday and would like to have procedures scheduled no earlier than 6 weeks following surgery. Denies weight loss, abdominal pain, constipation, diarrhea, change in stool caliber, melena, hematochezia, nausea, vomiting, chest pain.   Allergies  Allergen Reactions  . Ampicillin     REACTION: Rash Has patient had a PCN reaction causing immediate rash, facial/tongue/throat swelling, SOB or lightheadedness with hypotension: unknown Has patient had a PCN reaction causing severe rash involving mucus membranes or skin necrosis: unknown  Has patient had a PCN reaction that required hospitalization No Has patient had a PCN reaction occurring within the last 10 years: No If all of the above answers are "NO", then may proceed with Cephalosporin use.    Outpatient Medications Prior to Visit  Medication Sig Dispense Refill  . Ascorbic Acid (VITAMIN C) 1000 MG tablet Take 1,000 mg by mouth daily.      . Beclomethasone Dipropionate (QNASL) 80 MCG/ACT AERS Place 1 spray into the nose daily as needed (allergies).    . Calcium Carb-Cholecalciferol (CALCIUM 1000 + D PO) Take 1 tablet by mouth daily.    . celecoxib (CELEBREX) 200 MG capsule Take 1 capsule (200  mg total) by mouth daily. 30 capsule 2  . Cholecalciferol (D3-1000 PO) Take 1 tablet by mouth daily.    . Glucosamine-Chondroitin (GLUCOSAMINE CHONDR COMPLEX PO) Take 1 tablet by mouth daily.     Marland Kitchen levocetirizine (XYZAL) 5 MG tablet Take 5 mg by mouth every evening.  5  . Magnesium 400 MG CAPS Take 400 mg by mouth daily.    . montelukast (SINGULAIR) 10 MG tablet Take 10 mg by mouth every evening.  5  . Multiple Vitamin (MULTIVITAMIN) tablet Take 1 tablet by mouth daily.      . Omega-3 Fatty Acids (FISH OIL) 1000 MG CAPS Take 1 capsule by mouth daily.    Marland Kitchen tiZANidine (ZANAFLEX) 4 MG tablet TAKE 1/2 TO 1 TABLET BY MOUTH EVERY DAY AT BEDTIME 30 tablet 2  . valACYclovir (VALTREX) 500 MG tablet Take one tablet twice daily for 3-5 days the prn as needed 30 tablet 12  . venlafaxine XR (EFFEXOR-XR) 75 MG 24 hr capsule TAKE 2 CAPSULES BY MOUTH EVERY DAY (Patient taking differently: TAKE 1 CAPSULES BY MOUTH TWICE DAILY) 180 capsule 1  . vitamin E 400 UNIT capsule Take 400 Units by mouth daily.    Marland Kitchen zolpidem (AMBIEN) 5 MG tablet TAKE 1 TABLET BY MOUTH AT BEDTIME AS NEEDED FOR SLEEP (Patient taking differently: TAKE 0.5  TABLET BY MOUTH AT BEDTIME AS NEEDED FOR SLEEP) 30 tablet 0  . fluticasone (FLONASE) 50 MCG/ACT nasal spray Place 2 sprays into both nostrils daily. 16 g 11   Facility-Administered Medications Prior to Visit  Medication Dose Route Frequency Provider Last  Rate Last Dose  . bupivacaine (MARCAINE) 0.25 % (with pres) injection 5 mL  5 mL Other Once Magnus Sinning, MD      . lidocaine (PF) (XYLOCAINE) 1 % injection 0.3 mL  0.3 mL Other Once Magnus Sinning, MD      . lidocaine (PF) (XYLOCAINE) 1 % injection 0.3 mL  0.3 mL Other Once Magnus Sinning, MD       Past Medical History:  Diagnosis Date  . Arthritis   . Fibroid aug. 2010  . GERD (gastroesophageal reflux disease)   . Infertility    Past Surgical History:  Procedure Laterality Date  . HAND SURGERY  2009   LEFT  . HARRINGTON  RODS     AGE 74  . right hand surgery  2013   Social History   Social History  . Marital status: Married    Spouse name: N/A  . Number of children: N/A  . Years of education: N/A   Social History Main Topics  . Smoking status: Never Smoker  . Smokeless tobacco: Never Used  . Alcohol use 4.2 oz/week    7 Glasses of wine per week     Comment: sometimes  . Drug use: No  . Sexual activity: Yes    Partners: Male    Birth control/ protection: Post-menopausal   Other Topics Concern  . None   Social History Narrative   Pt has about 3 caffeine drinks daily ( tea)   Family History  Problem Relation Age of Onset  . Parkinsonism Sister   . Lung cancer Mother   . Parkinson's disease Father       Review of Systems: Pertinent positive and negative review of systems were noted in the above HPI section. All other review of systems were otherwise negative.   Physical Exam: General: Well developed, well nourished, no acute distress Head: Normocephalic and atraumatic Eyes:  sclerae anicteric, EOMI Ears: Normal auditory acuity Mouth: No deformity or lesions Neck: Supple, no masses or thyromegaly Lungs: Clear throughout to auscultation Heart: Regular rate and rhythm; no murmurs, rubs or bruits Abdomen: Soft, non tender and non distended. No masses, hepatosplenomegaly or hernias noted. Normal Bowel sounds Rectal: deferred to colonoscopy  Musculoskeletal: Symmetrical with no gross deformities  Skin: No lesions on visible extremities Pulses:  Normal pulses noted Extremities: No clubbing, cyanosis, edema or deformities noted Neurological: Alert oriented x 4, grossly nonfocal Cervical Nodes:  No significant cervical adenopathy Inguinal Nodes: No significant inguinal adenopathy Psychological:  Alert and cooperative. Normal mood and affect  Assessment and Recommendations:  1. GERD, dysphagia. Rule out esophageal stricture, esophagitis. Begin standard antireflux measures and  omeprazole 20 mg daily. Schedule EGD. The risks (including bleeding, perforation, infection, missed lesions, medication reactions and possible hospitalization or surgery if complications occur), benefits, and alternatives to endoscopy with possible biopsy and possible dilation were discussed with the patient and they consent to proceed.   2. CRC screening, average risk. The risks (including bleeding, perforation, infection, missed lesions, medication reactions and possible hospitalization or surgery if complications occur), benefits, and alternatives to colonoscopy with possible biopsy and possible polypectomy were discussed with the patient and they consent to proceed.    cc: Renato Shin, MD [address] [place],  [address]

## 2016-04-21 NOTE — H&P (Signed)
TOTAL KNEE ADMISSION H&P  Patient is being admitted for right total knee arthroplasty.  Subjective:  Chief Complaint:right knee pain.  HPI: Melissa [name], 61 y.o. female, has a history of pain and functional disability in the right knee due to arthritis and has failed non-surgical conservative treatments for greater than 12 weeks to includeNSAID's and/or analgesics, corticosteriod injections, viscosupplementation injections and activity modification.  Onset of symptoms was gradual, starting >10 years ago with gradually worsening course since that time. The patient noted no past surgery on the right knee(s).  Patient currently rates pain in the right knee(s) at 9 out of 10 with activity. Patient has night pain, worsening of pain with activity and weight bearing, pain that interferes with activities of daily living and pain with passive range of motion.  Patient has evidence of subchondral sclerosis and joint space narrowing by imaging studies. This patient has had Significant pain over the last year which is becoming refractory to all nonoperative management strategies. There is no active infection.  Patient Active Problem List   Diagnosis Date Noted  . GERD (gastroesophageal reflux disease) 03/12/2016  . Spondylosis without myelopathy or radiculopathy, lumbar region 02/23/2016  . Anxiety state, unspecified 06/17/2013  . Routine general medical examination at a health care facility 11/15/2011  . Dysphagia 11/09/2010  . DIARRHEA 11/10/2009  . KNEE PAIN, RIGHT 08/26/2009  . COUGH 12/21/2008  . TINNITUS, RIGHT 07/30/2007  . INSOMNIA 07/30/2007  . Leukocytopenia 07/29/2007  . ALLERGIC RHINITIS 07/29/2007  . BRONCHITIS 07/29/2007  . SCOLIOSIS 07/29/2007  . IRRITABLE BOWEL SYNDROME, HX OF 07/29/2007  . Osteoporosis 07/24/2007   Past Medical History:  Diagnosis Date  . Arthritis   . Fibroid aug. 2010  . GERD (gastroesophageal reflux disease)   . Infertility     Past Surgical History:   Procedure Laterality Date  . HAND SURGERY  2009   LEFT  . HARRINGTON RODS     AGE 29  . right hand surgery  2013    No prescriptions prior to admission.   Allergies  Allergen Reactions  . Ampicillin     REACTION: Rash Has patient had a PCN reaction causing immediate rash, facial/tongue/throat swelling, SOB or lightheadedness with hypotension: unknown Has patient had a PCN reaction causing severe rash involving mucus membranes or skin necrosis: unknown  Has patient had a PCN reaction that required hospitalization No Has patient had a PCN reaction occurring within the last 10 years: No If all of the above answers are "NO", then may proceed with Cephalosporin use.     Social History  Substance Use Topics  . Smoking status: Never Smoker  . Smokeless tobacco: Never Used  . Alcohol use 4.2 oz/week    7 Glasses of wine per week     Comment: sometimes    Family History  Problem Relation Age of Onset  . Parkinsonism Sister   . Lung cancer Mother   . Parkinson's disease Father      Review of Systems  Musculoskeletal: Positive for joint pain.  All other systems reviewed and are negative.   Objective:  Physical Exam  Constitutional: She appears well-developed.  HENT:  Head: Normocephalic.  Eyes: Pupils are equal, round, and reactive to light.  Neck: Normal range of motion.  Cardiovascular: Normal rate.   Respiratory: Effort normal.  Neurological: She is alert.  Skin: Skin is warm.  Psychiatric: She has a normal mood and affect.  Examination of the right knee demonstrates about a 5 flexion contracture but good  flexion past 90.  Collateral crucial ligaments are stable pedal pulses intact no groin pain with internal/external rotation of the leg skin is intact medial joint line tenderness is present  Vital signs in last 24 hours:    Labs:   Estimated body mass index is 25.99 kg/m as calculated from the following:   Height as of 04/19/16: 5\' 6"  (1.676 m).   Weight as  of 04/19/16: 161 lb (73 kg).   Imaging Review Plain radiographs demonstrate moderate degenerative joint disease of the right knee(s). The overall alignment ismild varus. The bone quality appears to be good for age and reported activity level.  Assessment/Plan:  End stage arthritis, right knee   The patient history, physical examination, clinical judgment of the provider and imaging studies are consistent with end stage degenerative joint disease of the right knee(s) and total knee arthroplasty is deemed medically necessary. The treatment options including medical management, injection therapy arthroscopy and arthroplasty were discussed at length. The risks and benefits of total knee arthroplasty were presented and reviewed. The risks due to aseptic loosening, infection, stiffness, patella tracking problems, thromboembolic complications and other imponderables were discussed. The patient acknowledged the explanation, agreed to proceed with the plan and consent was signed. Patient is being admitted for inpatient treatment for surgery, pain control, PT, OT, prophylactic antibiotics, VTE prophylaxis, progressive ambulation and ADL's and discharge planning. The patient is planning to be discharged home with home health services depending on bone quality the patient may be a reasonable candidate for press fit knee replacement.  We will have to evaluate that at the time of surgery

## 2016-04-23 MED ORDER — CLINDAMYCIN PHOSPHATE 900 MG/50ML IV SOLN
900.0000 mg | INTRAVENOUS | Status: AC
  Administered 2016-04-24: 900 mg via INTRAVENOUS
  Filled 2016-04-23: qty 50

## 2016-04-23 NOTE — Anesthesia Preprocedure Evaluation (Addendum)
Anesthesia Evaluation  Patient identified by MRN, date of birth, ID band Patient awake    Reviewed: Allergy & Precautions, NPO status , Patient's Chart, lab work & pertinent test results  Airway Mallampati: II  TM Distance: >3 FB Neck ROM: Full    Dental no notable dental hx.    Pulmonary neg pulmonary ROS,    Pulmonary exam normal breath sounds clear to auscultation       Cardiovascular negative cardio ROS Normal cardiovascular exam Rhythm:Regular Rate:Normal     Neuro/Psych PSYCHIATRIC DISORDERS Anxiety negative neurological ROS     GI/Hepatic negative GI ROS, Neg liver ROS,   Endo/Other  negative endocrine ROS  Renal/GU negative Renal ROS     Musculoskeletal  (+) Arthritis ,   Abdominal   Peds  Hematology negative hematology ROS (+)   Anesthesia Other Findings   Reproductive/Obstetrics negative OB ROS                             Anesthesia Physical Anesthesia Plan  ASA: II  Anesthesia Plan: Regional and Spinal   Post-op Pain Management:  Regional for Post-op pain   Induction: Intravenous  Airway Management Planned: Nasal Cannula  Additional Equipment: None  Intra-op Plan:   Post-operative Plan: Extubation in OR  Informed Consent: I have reviewed the patients History and Physical, chart, labs and discussed the procedure including the risks, benefits and alternatives for the proposed anesthesia with the patient or authorized representative who has indicated his/her understanding and acceptance.   Dental advisory given  Plan Discussed with: CRNA, Anesthesiologist and Surgeon  Anesthesia Plan Comments:        Anesthesia Quick Evaluation

## 2016-04-24 ENCOUNTER — Encounter (HOSPITAL_COMMUNITY): Payer: Self-pay | Admitting: Urology

## 2016-04-24 ENCOUNTER — Encounter (HOSPITAL_COMMUNITY)
Admission: RE | Disposition: A | Discharge status: Home / Self Care | Payer: Self-pay | Source: Ambulatory Visit | Attending: Orthopedic Surgery

## 2016-04-24 ENCOUNTER — Inpatient Hospital Stay (HOSPITAL_COMMUNITY): Payer: BLUE CROSS/BLUE SHIELD | Admitting: Certified Registered"

## 2016-04-24 ENCOUNTER — Inpatient Hospital Stay (HOSPITAL_COMMUNITY)
Admission: RE | Admit: 2016-04-24 | Discharge: 2016-04-27 | DRG: 470 | Disposition: A | Discharge status: Home / Self Care | Payer: BLUE CROSS/BLUE SHIELD | Source: Ambulatory Visit | Attending: Orthopedic Surgery | Admitting: Orthopedic Surgery

## 2016-04-24 ENCOUNTER — Inpatient Hospital Stay (HOSPITAL_COMMUNITY): Payer: BLUE CROSS/BLUE SHIELD

## 2016-04-24 DIAGNOSIS — M1711 Unilateral primary osteoarthritis, right knee: Principal | ICD-10-CM | POA: Diagnosis present

## 2016-04-24 DIAGNOSIS — M810 Age-related osteoporosis without current pathological fracture: Secondary | ICD-10-CM | POA: Diagnosis present

## 2016-04-24 DIAGNOSIS — Z880 Allergy status to penicillin: Secondary | ICD-10-CM

## 2016-04-24 DIAGNOSIS — M1710 Unilateral primary osteoarthritis, unspecified knee: Secondary | ICD-10-CM | POA: Diagnosis present

## 2016-04-24 DIAGNOSIS — Z419 Encounter for procedure for purposes other than remedying health state, unspecified: Secondary | ICD-10-CM

## 2016-04-24 HISTORY — PX: TOTAL KNEE ARTHROPLASTY: SHX125

## 2016-04-24 HISTORY — PX: REPLACEMENT TOTAL KNEE: SUR1224

## 2016-04-24 LAB — URINALYSIS, ROUTINE W REFLEX MICROSCOPIC
BILIRUBIN URINE: NEGATIVE
Glucose, UA: NEGATIVE mg/dL
Hgb urine dipstick: NEGATIVE
KETONES UR: NEGATIVE mg/dL
LEUKOCYTES UA: NEGATIVE
NITRITE: NEGATIVE
Protein, ur: NEGATIVE mg/dL
SPECIFIC GRAVITY, URINE: 1.013 (ref 1.005–1.030)
pH: 6 (ref 5.0–8.0)

## 2016-04-24 SURGERY — ARTHROPLASTY, KNEE, TOTAL
Anesthesia: Regional | Site: Knee | Laterality: Right

## 2016-04-24 MED ORDER — PROPOFOL 10 MG/ML IV BOLUS
INTRAVENOUS | Status: AC
  Filled 2016-04-24: qty 20

## 2016-04-24 MED ORDER — ZOLPIDEM TARTRATE 5 MG PO TABS: 5.0000 mg | ORAL_TABLET | Freq: Every evening | ORAL | Status: DC | PRN

## 2016-04-24 MED ORDER — CHLORHEXIDINE GLUCONATE 4 % EX LIQD: 60.0000 mL | Freq: Once | CUTANEOUS | Status: DC

## 2016-04-24 MED ORDER — LACTATED RINGERS IV SOLN
INTRAVENOUS | Status: DC | PRN
  Administered 2016-04-24 (×2): via INTRAVENOUS

## 2016-04-24 MED ORDER — ROPIVACAINE HCL 5 MG/ML IJ SOLN
INTRAMUSCULAR | Status: DC | PRN
  Administered 2016-04-24: 3 mL via INTRATHECAL

## 2016-04-24 MED ORDER — TIZANIDINE HCL 4 MG PO TABS
4.0000 mg | ORAL_TABLET | Freq: Three times a day (TID) | ORAL | Status: DC | PRN
  Administered 2016-04-24 – 2016-04-26 (×6): 4 mg via ORAL
  Filled 2016-04-24 (×7): qty 1

## 2016-04-24 MED ORDER — PROMETHAZINE HCL 25 MG/ML IJ SOLN: 6.2500 mg | INTRAMUSCULAR | Status: DC | PRN

## 2016-04-24 MED ORDER — PHENOL 1.4 % MT LIQD: 1.0000 | OROMUCOSAL | Status: DC | PRN

## 2016-04-24 MED ORDER — METOCLOPRAMIDE HCL 5 MG PO TABS: 5.0000 mg | ORAL_TABLET | Freq: Three times a day (TID) | ORAL | Status: DC | PRN

## 2016-04-24 MED ORDER — ADULT MULTIVITAMIN W/MINERALS CH
1.0000 | ORAL_TABLET | Freq: Every day | ORAL | Status: DC
  Administered 2016-04-25 – 2016-04-27 (×2): 1 via ORAL
  Filled 2016-04-24 (×2): qty 1

## 2016-04-24 MED ORDER — BUPIVACAINE HCL (PF) 0.25 % IJ SOLN
INTRAMUSCULAR | Status: DC | PRN
  Administered 2016-04-24 (×2): 30 mL

## 2016-04-24 MED ORDER — RIVAROXABAN 10 MG PO TABS
10.0000 mg | ORAL_TABLET | Freq: Every day | ORAL | Status: DC
  Administered 2016-04-25 – 2016-04-27 (×3): 10 mg via ORAL
  Filled 2016-04-24 (×3): qty 1

## 2016-04-24 MED ORDER — CALCIUM CARB-CHOLECALCIFEROL 1000-800 MG-UNIT PO TABS: ORAL_TABLET | Freq: Every day | ORAL | Status: DC

## 2016-04-24 MED ORDER — ACETAMINOPHEN 650 MG RE SUPP: 650.0000 mg | Freq: Four times a day (QID) | RECTAL | Status: DC | PRN

## 2016-04-24 MED ORDER — ONDANSETRON HCL 4 MG PO TABS: 4.0000 mg | ORAL_TABLET | Freq: Four times a day (QID) | ORAL | Status: DC | PRN

## 2016-04-24 MED ORDER — CALCIUM CARBONATE-VITAMIN D 500-200 MG-UNIT PO TABS
1.0000 | ORAL_TABLET | Freq: Every day | ORAL | Status: DC
  Administered 2016-04-25 – 2016-04-27 (×2): 1 via ORAL
  Filled 2016-04-24 (×2): qty 1

## 2016-04-24 MED ORDER — BUPIVACAINE HCL (PF) 0.25 % IJ SOLN
INTRAMUSCULAR | Status: AC
  Filled 2016-04-24: qty 30

## 2016-04-24 MED ORDER — MIDAZOLAM HCL 2 MG/2ML IJ SOLN
INTRAMUSCULAR | Status: AC
  Filled 2016-04-24: qty 2

## 2016-04-24 MED ORDER — TRANEXAMIC ACID 1000 MG/10ML IV SOLN
2000.0000 mg | INTRAVENOUS | Status: AC
  Administered 2016-04-24: 2000 mg via TOPICAL
  Filled 2016-04-24: qty 20

## 2016-04-24 MED ORDER — ACETAMINOPHEN 325 MG PO TABS
650.0000 mg | ORAL_TABLET | Freq: Four times a day (QID) | ORAL | Status: DC | PRN
  Administered 2016-04-25 – 2016-04-27 (×3): 650 mg via ORAL
  Filled 2016-04-24 (×4): qty 2

## 2016-04-24 MED ORDER — ROPIVACAINE HCL 7.5 MG/ML IJ SOLN
INTRAMUSCULAR | Status: DC | PRN
Start: 2016-04-24 — End: 2016-04-24
  Administered 2016-04-24: 20 mL via PERINEURAL

## 2016-04-24 MED ORDER — OXYCODONE HCL 5 MG/5ML PO SOLN: 5.0000 mg | Freq: Once | ORAL | Status: DC | PRN

## 2016-04-24 MED ORDER — MEPERIDINE HCL 25 MG/ML IJ SOLN: 6.2500 mg | INTRAMUSCULAR | Status: DC | PRN

## 2016-04-24 MED ORDER — CHOLECALCIFEROL 25 MCG (1000 UT) PO CAPS: ORAL_CAPSULE | Freq: Every day | ORAL | Status: DC

## 2016-04-24 MED ORDER — PANTOPRAZOLE SODIUM 40 MG PO TBEC
40.0000 mg | DELAYED_RELEASE_TABLET | Freq: Every day | ORAL | Status: DC
  Administered 2016-04-25 – 2016-04-27 (×3): 40 mg via ORAL
  Filled 2016-04-24 (×3): qty 1

## 2016-04-24 MED ORDER — VENLAFAXINE HCL ER 150 MG PO CP24
150.0000 mg | ORAL_CAPSULE | Freq: Every day | ORAL | Status: DC
  Administered 2016-04-24 – 2016-04-27 (×4): 150 mg via ORAL
  Filled 2016-04-24 (×4): qty 1

## 2016-04-24 MED ORDER — MENTHOL 3 MG MT LOZG: 1.0000 | LOZENGE | OROMUCOSAL | Status: DC | PRN

## 2016-04-24 MED ORDER — FENTANYL CITRATE (PF) 100 MCG/2ML IJ SOLN
INTRAMUSCULAR | Status: AC
  Filled 2016-04-24: qty 2

## 2016-04-24 MED ORDER — MONTELUKAST SODIUM 10 MG PO TABS
10.0000 mg | ORAL_TABLET | Freq: Every evening | ORAL | Status: DC
  Administered 2016-04-24 – 2016-04-26 (×3): 10 mg via ORAL
  Filled 2016-04-24 (×3): qty 1

## 2016-04-24 MED ORDER — OXYCODONE HCL 5 MG PO TABS: 5.0000 mg | ORAL_TABLET | Freq: Once | ORAL | Status: DC | PRN

## 2016-04-24 MED ORDER — LEVOCETIRIZINE DIHYDROCHLORIDE 5 MG PO TABS: 5.0000 mg | ORAL_TABLET | Freq: Every evening | ORAL | Status: DC

## 2016-04-24 MED ORDER — MORPHINE SULFATE (PF) 4 MG/ML IV SOLN
INTRAVENOUS | Status: DC | PRN
  Administered 2016-04-24: 8 mg via INTRAVENOUS

## 2016-04-24 MED ORDER — EPINEPHRINE PF 1 MG/ML IJ SOLN
INTRAMUSCULAR | Status: AC
  Filled 2016-04-24: qty 1

## 2016-04-24 MED ORDER — 0.9 % SODIUM CHLORIDE (POUR BTL) OPTIME
TOPICAL | Status: DC | PRN
  Administered 2016-04-24 (×3): 1000 mL

## 2016-04-24 MED ORDER — BUPIVACAINE LIPOSOME 1.3 % IJ SUSP
20.0000 mL | INTRAMUSCULAR | Status: AC
  Administered 2016-04-24: 20 mL
  Filled 2016-04-24: qty 20

## 2016-04-24 MED ORDER — TRANEXAMIC ACID 1000 MG/10ML IV SOLN
1000.0000 mg | INTRAVENOUS | Status: AC
  Administered 2016-04-24: 1000 mg via INTRAVENOUS
  Filled 2016-04-24: qty 10

## 2016-04-24 MED ORDER — SALINE FLUSH 0.9 % IV SOLN
INTRAVENOUS | Status: DC | PRN
  Administered 2016-04-24: 20 mL via INTRA_ARTICULAR

## 2016-04-24 MED ORDER — MORPHINE SULFATE (PF) 4 MG/ML IV SOLN
4.0000 mg | INTRAVENOUS | Status: DC | PRN
  Administered 2016-04-24: 4 mg via INTRAVENOUS
  Filled 2016-04-24: qty 1

## 2016-04-24 MED ORDER — FENTANYL CITRATE (PF) 100 MCG/2ML IJ SOLN
INTRAMUSCULAR | Status: DC | PRN
  Administered 2016-04-24: 50 ug via INTRAVENOUS

## 2016-04-24 MED ORDER — METOCLOPRAMIDE HCL 5 MG/ML IJ SOLN: 5.0000 mg | Freq: Three times a day (TID) | INTRAMUSCULAR | Status: DC | PRN

## 2016-04-24 MED ORDER — MORPHINE SULFATE (PF) 4 MG/ML IV SOLN
INTRAVENOUS | Status: AC
  Filled 2016-04-24: qty 2

## 2016-04-24 MED ORDER — MIDAZOLAM HCL 5 MG/5ML IJ SOLN
INTRAMUSCULAR | Status: DC | PRN
  Administered 2016-04-24 (×2): 1 mg via INTRAVENOUS

## 2016-04-24 MED ORDER — SODIUM CHLORIDE 0.9 % IR SOLN
Status: DC | PRN
  Administered 2016-04-24 (×2): 3000 mL

## 2016-04-24 MED ORDER — HYDROMORPHONE HCL 1 MG/ML IJ SOLN: 0.2500 mg | INTRAMUSCULAR | Status: DC | PRN

## 2016-04-24 MED ORDER — BUPIVACAINE-EPINEPHRINE (PF) 0.5% -1:200000 IJ SOLN
INTRAMUSCULAR | Status: AC
  Filled 2016-04-24: qty 30

## 2016-04-24 MED ORDER — CELECOXIB 200 MG PO CAPS: 200.0000 mg | ORAL_CAPSULE | Freq: Every day | ORAL | Status: DC

## 2016-04-24 MED ORDER — CLONIDINE HCL (ANALGESIA) 100 MCG/ML EP SOLN
150.0000 ug | Freq: Once | EPIDURAL | Status: AC
  Administered 2016-04-24: 1 mL via INTRA_ARTICULAR
  Filled 2016-04-24: qty 1.5

## 2016-04-24 MED ORDER — PROPOFOL 500 MG/50ML IV EMUL
INTRAVENOUS | Status: DC | PRN
  Administered 2016-04-24: 50 ug/kg/min via INTRAVENOUS

## 2016-04-24 MED ORDER — PROPOFOL 1000 MG/100ML IV EMUL
INTRAVENOUS | Status: AC
  Filled 2016-04-24: qty 100

## 2016-04-24 MED ORDER — ONDANSETRON HCL 4 MG/2ML IJ SOLN: 4.0000 mg | Freq: Four times a day (QID) | INTRAMUSCULAR | Status: DC | PRN

## 2016-04-24 MED ORDER — CLINDAMYCIN PHOSPHATE 600 MG/50ML IV SOLN
600.0000 mg | Freq: Four times a day (QID) | INTRAVENOUS | Status: AC
  Administered 2016-04-24 (×2): 600 mg via INTRAVENOUS
  Filled 2016-04-24 (×2): qty 50

## 2016-04-24 MED ORDER — OXYCODONE HCL 5 MG PO TABS
5.0000 mg | ORAL_TABLET | ORAL | Status: DC | PRN
Start: 2016-04-24 — End: 2016-04-26
  Administered 2016-04-24 – 2016-04-26 (×12): 10 mg via ORAL
  Filled 2016-04-24 (×12): qty 2

## 2016-04-24 MED FILL — Sodium Chloride Flush IV Soln 0.9%: INTRAVENOUS | Qty: 20 | Status: AC

## 2016-04-24 SURGICAL SUPPLY — 79 items
BANDAGE ACE 4X5 VEL STRL LF (GAUZE/BANDAGES/DRESSINGS) ×3 IMPLANT
BANDAGE ESMARK 6X9 LF (GAUZE/BANDAGES/DRESSINGS) ×1 IMPLANT
BLADE SAG 18X100X1.27 (BLADE) ×3 IMPLANT
BLADE SAW SGTL 13.0X1.19X90.0M (BLADE) IMPLANT
BNDG CMPR 9X6 STRL LF SNTH (GAUZE/BANDAGES/DRESSINGS) ×1
BNDG CMPR MED 15X6 ELC VLCR LF (GAUZE/BANDAGES/DRESSINGS) ×1
BNDG COHESIVE 6X5 TAN STRL LF (GAUZE/BANDAGES/DRESSINGS) ×3 IMPLANT
BNDG ELASTIC 6X15 VLCR STRL LF (GAUZE/BANDAGES/DRESSINGS) ×5 IMPLANT
BNDG ESMARK 6X9 LF (GAUZE/BANDAGES/DRESSINGS) ×3
BOWL SMART MIX CTS (DISPOSABLE) IMPLANT
CAPT KNEE TRIATH TK-4 ×2 IMPLANT
CLOSURE WOUND 1/2 X4 (GAUZE/BANDAGES/DRESSINGS) ×1
COVER SURGICAL LIGHT HANDLE (MISCELLANEOUS) ×3 IMPLANT
CUFF TOURNIQUET SINGLE 34IN LL (TOURNIQUET CUFF) ×3 IMPLANT
CUFF TOURNIQUET SINGLE 44IN (TOURNIQUET CUFF) IMPLANT
DECANTER SPIKE VIAL GLASS SM (MISCELLANEOUS) ×3 IMPLANT
DRAPE INCISE IOBAN 66X45 STRL (DRAPES) IMPLANT
DRAPE ORTHO SPLIT 77X108 STRL (DRAPES) ×9
DRAPE SURG ORHT 6 SPLT 77X108 (DRAPES) ×3 IMPLANT
DRAPE U-SHAPE 47X51 STRL (DRAPES) ×3 IMPLANT
DRSG AQUACEL AG ADV 3.5X10 (GAUZE/BANDAGES/DRESSINGS) ×3 IMPLANT
DRSG AQUACEL AG ADV 3.5X14 (GAUZE/BANDAGES/DRESSINGS) ×2 IMPLANT
DRSG PAD ABDOMINAL 8X10 ST (GAUZE/BANDAGES/DRESSINGS) ×6 IMPLANT
DURAPREP 26ML APPLICATOR (WOUND CARE) ×6 IMPLANT
ELECT CAUTERY BLADE 6.4 (BLADE) ×3 IMPLANT
ELECT REM PT RETURN 9FT ADLT (ELECTROSURGICAL) ×3
ELECTRODE REM PT RTRN 9FT ADLT (ELECTROSURGICAL) ×1 IMPLANT
GAUZE SPONGE 4X4 12PLY STRL (GAUZE/BANDAGES/DRESSINGS) ×3 IMPLANT
GLOVE BIOGEL PI IND STRL 6.5 (GLOVE) IMPLANT
GLOVE BIOGEL PI IND STRL 7.0 (GLOVE) IMPLANT
GLOVE BIOGEL PI IND STRL 7.5 (GLOVE) ×1 IMPLANT
GLOVE BIOGEL PI IND STRL 8 (GLOVE) ×1 IMPLANT
GLOVE BIOGEL PI INDICATOR 6.5 (GLOVE) ×2
GLOVE BIOGEL PI INDICATOR 7.0 (GLOVE) ×2
GLOVE BIOGEL PI INDICATOR 7.5 (GLOVE) ×2
GLOVE BIOGEL PI INDICATOR 8 (GLOVE) ×2
GLOVE ECLIPSE 7.0 STRL STRAW (GLOVE) ×7 IMPLANT
GLOVE SURG ORTHO 8.0 STRL STRW (GLOVE) ×3 IMPLANT
GLOVE SURG SS PI 6.5 STRL IVOR (GLOVE) ×4 IMPLANT
GOWN STRL REUS W/ TWL LRG LVL3 (GOWN DISPOSABLE) ×3 IMPLANT
GOWN STRL REUS W/TWL LRG LVL3 (GOWN DISPOSABLE) ×9
HANDPIECE INTERPULSE COAX TIP (DISPOSABLE) ×3
HOOD PEEL AWAY FLYTE STAYCOOL (MISCELLANEOUS) ×9 IMPLANT
IMMOBILIZER KNEE 20 (SOFTGOODS) IMPLANT
IMMOBILIZER KNEE 22 UNIV (SOFTGOODS) IMPLANT
IMMOBILIZER KNEE 24 THIGH 36 (MISCELLANEOUS) IMPLANT
IMMOBILIZER KNEE 24 UNIV (MISCELLANEOUS)
KIT BASIN OR (CUSTOM PROCEDURE TRAY) ×3 IMPLANT
KIT ROOM TURNOVER OR (KITS) ×3 IMPLANT
MANIFOLD NEPTUNE II (INSTRUMENTS) ×3 IMPLANT
NDL SAFETY ECLIPSE 18X1.5 (NEEDLE) ×1 IMPLANT
NDL SPNL 18GX3.5 QUINCKE PK (NEEDLE) ×1 IMPLANT
NEEDLE HYPO 18GX1.5 SHARP (NEEDLE) ×3
NEEDLE HYPO 22GX1.5 SAFETY (NEEDLE) ×3 IMPLANT
NEEDLE SPNL 18GX3.5 QUINCKE PK (NEEDLE) ×3 IMPLANT
NS IRRIG 1000ML POUR BTL (IV SOLUTION) ×6 IMPLANT
PACK TOTAL JOINT (CUSTOM PROCEDURE TRAY) ×3 IMPLANT
PAD ARMBOARD 7.5X6 YLW CONV (MISCELLANEOUS) ×6 IMPLANT
PAD CAST 4YDX4 CTTN HI CHSV (CAST SUPPLIES) ×1 IMPLANT
PADDING CAST COTTON 4X4 STRL (CAST SUPPLIES) ×3
PADDING CAST COTTON 6X4 STRL (CAST SUPPLIES) ×3 IMPLANT
SET HNDPC FAN SPRY TIP SCT (DISPOSABLE) IMPLANT
SPONGE LAP 18X18 X RAY DECT (DISPOSABLE) IMPLANT
STRIP CLOSURE SKIN 1/2X4 (GAUZE/BANDAGES/DRESSINGS) ×3 IMPLANT
SUCTION FRAZIER HANDLE 10FR (MISCELLANEOUS) ×2
SUCTION TUBE FRAZIER 10FR DISP (MISCELLANEOUS) ×1 IMPLANT
SUT MNCRL AB 3-0 PS2 18 (SUTURE) ×3 IMPLANT
SUT VIC AB 0 CT1 27 (SUTURE) ×9
SUT VIC AB 0 CT1 27XBRD ANBCTR (SUTURE) ×3 IMPLANT
SUT VIC AB 1 CT1 27 (SUTURE) ×15
SUT VIC AB 1 CT1 27XBRD ANBCTR (SUTURE) ×5 IMPLANT
SUT VIC AB 2-0 CT1 27 (SUTURE) ×12
SUT VIC AB 2-0 CT1 TAPERPNT 27 (SUTURE) ×4 IMPLANT
SYR 30ML LL (SYRINGE) ×9 IMPLANT
SYR TB 1ML LUER SLIP (SYRINGE) ×3 IMPLANT
TOWEL OR 17X24 6PK STRL BLUE (TOWEL DISPOSABLE) ×6 IMPLANT
TOWEL OR 17X26 10 PK STRL BLUE (TOWEL DISPOSABLE) ×6 IMPLANT
TRAY CATH 16FR W/PLASTIC CATH (SET/KITS/TRAYS/PACK) IMPLANT
WATER STERILE IRR 1000ML POUR (IV SOLUTION) ×2 IMPLANT

## 2016-04-24 NOTE — Progress Notes (Signed)
Report given to [facility] rn as caregiver as caregiver

## 2016-04-24 NOTE — Interval H&P Note (Signed)
History and Physical Interval Note:  04/24/2016 7:24 AM  Melissa [name]  has presented today for surgery, with the diagnosis of RIGHT KNEE OSTEOARTHRITIS  The various methods of treatment have been discussed with the patient and family. After consideration of risks, benefits and other options for treatment, the patient has consented to  Procedure(s): TOTAL KNEE ARTHROPLASTY (Right) as a surgical intervention .  The patient's history has been reviewed, patient examined, no change in status, stable for surgery.  I have reviewed the patient's chart and labs.  Questions were answered to the patient's satisfaction.     Anderson Malta

## 2016-04-24 NOTE — Progress Notes (Signed)
PT Cancellation Note  Patient Details Name: Melissa [name] MRN: [id number] DOB: 06-06-1955   Cancelled Treatment:    Reason Eval/Treat Not Completed: Fatigue/lethargy limiting ability to participate; patient just up to chair with nursing and currently nauseated.  Will attempt to see another time.    Reginia Naas 04/24/2016, 5:08 PM  Magda Kiel, [place] 04/24/2016

## 2016-04-24 NOTE — Progress Notes (Signed)
Orthopedic Tech Progress Note Patient Details:  Melissa [name] 09-07-1955 [id number]  CPM Right Knee CPM Right Knee: On Right Knee Flexion (Degrees): 40 Right Knee Extension (Degrees): 10 Additional Comments: Trapeze bar and foot roll;   [name] 04/24/2016, 11:15 AM

## 2016-04-24 NOTE — Progress Notes (Signed)
Care of pt assumed by MA Tiyah Zelenak RN 

## 2016-04-24 NOTE — Progress Notes (Signed)
Patient c/o severe spasms right leg despite pain meds. Patient takes Zanaflex at home. MD made aware and Zanaflex ordered PRN

## 2016-04-24 NOTE — Anesthesia Procedure Notes (Addendum)
Anesthesia Regional Block: Adductor canal block   Pre-Anesthetic Checklist: ,, timeout performed, Correct Patient, Correct Site, Correct Laterality, Correct Procedure, Correct Position, site marked, Risks and benefits discussed,  Surgical consent,  Pre-op evaluation,  At surgeon's request and post-op pain management  Laterality: Right  Prep: chloraprep       Needles:  Injection technique: Single-shot  Needle Type: Stimiplex     Needle Length: 9cm  Needle Gauge: 21     Additional Needles:   Procedures: ultrasound guided,,,,,,,,  Narrative:  Start time: 04/24/2016 7:08 AM End time: 04/24/2016 7:11 AM Injection made incrementally with aspirations every 5 mL.  Performed by: Personally  Anesthesiologist: Nolon Nations  Additional Notes: BP cuff, EKG monitors applied. Sedation begun. Artery and nerve location verified with U/S and anesthetic injected incrementally, slowly, and after negative aspirations under direct u/s guidance. Good fascial /perineural spread. Tolerated well.

## 2016-04-24 NOTE — Brief Op Note (Signed)
04/24/2016  10:37 AM  PATIENT:  Delrae Sawyers  61 y.o. female  PRE-OPERATIVE DIAGNOSIS:  RIGHT KNEE OSTEOARTHRITIS  POST-OPERATIVE DIAGNOSIS:  RIGHT KNEE OSTEOARTHRITIS  PROCEDURE:  Procedure(s): TOTAL KNEE ARTHROPLASTY  SURGEON:  Surgeon(s): Meredith Pel, MD  ASSISTANT:Carla Modena Slater rnfa  ANESTHESIA:     EBL: 75 ml    Total I/O In: 1250 [I.V.:1250] Out: 475 [Urine:400; Blood:75]  BLOOD ADMINISTERED: none  DRAINS: none   LOCAL MEDICATIONS USED: Marcaine morphine clonidine    SPECIMEN:  No Specimen  COUNTS:  YES  TOURNIQUET:   Total Tourniquet Time Documented: Thigh (Right) - 74 minutes Total: Thigh (Right) - 74 minutes   DICTATION: .Other Dictation: Dictation Number [id number]  PLAN OF CARE: Admit to inpatient   PATIENT DISPOSITION:  PACU - hemodynamically stable

## 2016-04-24 NOTE — Anesthesia Procedure Notes (Signed)
Spinal  Patient location during procedure: OR Staffing Anesthesiologist: Nolon Nations Performed: anesthesiologist  Preanesthetic Checklist Completed: patient identified, site marked, surgical consent, pre-op evaluation, timeout performed, IV checked, risks and benefits discussed and monitors and equipment checked Spinal Block Patient position: sitting Prep: ChloraPrep and site prepped and draped Patient monitoring: heart rate, continuous pulse ox and blood pressure Approach: right paramedian Location: L4-5 Injection technique: single-shot Needle Needle type: Sprotte  Needle gauge: 24 G Needle length: 9 cm Additional Notes Expiration date of kit checked and confirmed. Patient tolerated procedure well, without complications.

## 2016-04-24 NOTE — Anesthesia Postprocedure Evaluation (Signed)
Anesthesia Post Note  Patient: Melissa [name]  Procedure(s) Performed: Procedure(s) (LRB): TOTAL KNEE ARTHROPLASTY (Right)  Patient location during evaluation: PACU Anesthesia Type: Regional and Spinal Level of consciousness: awake and alert Pain management: pain level controlled Vital Signs Assessment: post-procedure vital signs reviewed and stable Respiratory status: spontaneous breathing and respiratory function stable Cardiovascular status: blood pressure returned to baseline and stable Postop Assessment: spinal receding Anesthetic complications: no       Last Vitals:  Vitals:   04/24/16 1345 04/24/16 1414  BP: 132/82 140/75  Pulse: 61 65  Resp: 17 17  Temp:  36.4 C    Last Pain:  Vitals:   04/24/16 1532  TempSrc:   PainSc: 10-Worst pain ever                 Nolon Nations

## 2016-04-24 NOTE — Transfer of Care (Signed)
Immediate Anesthesia Transfer of Care Note  Patient: Melissa [name]  Procedure(s) Performed: Procedure(s): TOTAL KNEE ARTHROPLASTY (Right)  Patient Location: PACU  Anesthesia Type:Spinal and MAC combined with regional for post-op pain  Level of Consciousness: awake, oriented and patient cooperative  Airway & Oxygen Therapy: Patient Spontanous Breathing  Post-op Assessment: Report given to RN and Post -op Vital signs reviewed and stable  Post vital signs: Reviewed and stable  Last Vitals:  Vitals:   04/24/16 0639  BP: (!) 156/82  Pulse: 63  Resp: 20  Temp: 36.6 C    Last Pain:  Vitals:   04/24/16 0639  TempSrc: Oral         Complications: No apparent anesthesia complications

## 2016-04-25 ENCOUNTER — Encounter (HOSPITAL_COMMUNITY): Payer: Self-pay | Admitting: Orthopedic Surgery

## 2016-04-25 LAB — URINE CULTURE: CULTURE: NO GROWTH

## 2016-04-25 MED ORDER — SODIUM CHLORIDE 0.9 % IV BOLUS (SEPSIS)
500.0000 mL | Freq: Once | INTRAVENOUS | Status: AC
  Administered 2016-04-25: 500 mL via INTRAVENOUS

## 2016-04-25 NOTE — Op Note (Signed)
Melissa [name], Melissa [name] NO.:  [id number]  MEDICAL RECORD NO.:  [id number]  LOCATION:                                 FACILITY:  PHYSICIAN:  Anderson Malta, M.D.    DATE OF BIRTH:  Oct 16, 1955  DATE OF PROCEDURE: DATE OF DISCHARGE:                              OPERATIVE REPORT   PREOPERATIVE DIAGNOSIS:  Right knee arthritis.  POSTOPERATIVE DIAGNOSIS:  Right knee arthritis.  PROCEDURE:  Right total knee replacement using Stryker press-fit triathlon knee, 3 femur, 4 tibia, 11 mm polyethylene insert, 32 mm 3-peg press-fit patella.  SURGEON:  Anderson Malta, M.D.  ASSISTANT:  Laure Kidney, RNFA.  INDICATIONS:  Alegna is a 61 year old patient with end-stage right knee arthritis, presents for operative management after explanation of risks and benefits.  PROCEDURE IN DETAIL:  The patient was brought to the operating room where a spinal anesthetic was induced.  Time-out was called.  Right leg was prescrubbed alcohol and Betadine, allowed to air dry, prepped with DuraPrep solution and draped in a sterile manner.  Charlie Pitter was used to cover the operative field.  Leg was elevated and exsanguinated with the Esmarch wrap.  Tourniquet was inflated to 300 mmHg.  Total tourniquet time 74 minutes.  Anterior approach to the knee was made.  Skin and subcutaneous tissues were sharply divided.  Median parapatellar approach was made and then marked with a #1 Vicryl suture at the quad tendon. Fat pad was partially excised.  Soft tissue removed off the anterior distal femur.  Lateral patellofemoral ligament released.  Minimal soft tissue dissection performed medially.  The ACL was cut.  PCL was maintained.  Intramedullary alignment was then used to make a cut perpendicular in mechanical axis and 3 degrees of posterior slope.  The cut was made with collaterals protected.  Intramedullary alignment was then used to cut the distal femur in 5 degrees valgus, 8 mm cut.  9 mm was measured  off the least affected lateral tibial plateau on the tibia. This allowed a 9 mm spacer to be placed without hyperextension.  The anterior, posterior, and chamfer cuts were then made on the distal femur with excellent bone quality observed.  The tibia was then prepared and trialed.  It seemed that an 11 mm spacer was going to give about 2-3 degrees of hyperextension, full flexion and good stability to varus and valgus stress at 0, 30, and 90 degrees.  Patella was then cut with a significant wear on the medial facet of the patella.  Patella was cut from 20 down to 12 mm.  A 32 mm 3-peg patella was prepared for.  At this time, thorough irrigation was performed on the cut bony surfaces.  Final preparation on the tibia with a box cutting guide and press were then made.  The knee joint was thoroughly irrigated.  Exparel was then used to anesthetize the bone and the capsule.  Tranexamic acid was then placed, allowed to stay there for 3 minutes.  Irrigation then performed and the true components were press-fit into position using an 11 mm spacer, which gave excellent stability to varus and valgus stress at 0 and 30 degrees along with excellent patellar tracking.  Tourniquet released at this time.  Bleeding points were encountered and controlled with electrocautery.  Thorough irrigation again performed.  Incision was closed over bolster using #1 Vicryl suture for the arthrotomy, 0 Vicryl suture, 2-0 Vicryl suture, and a running 3- 0 Monocryl.  Aquacel dressing placed, followed by Ace wrap over top of Webril.  Knee immobilizer placed.  The patient tolerated the procedure well without immediate complications and transferred to the recovery room in stable condition.     Anderson Malta, M.D.   ______________________________ Darnell Level. Alphonzo Severance, M.D.    GSD/MEDQ  D:  04/24/2016  T:  04/24/2016  Job:  [id number]

## 2016-04-25 NOTE — Progress Notes (Signed)
Subjective: Patient doing well.  Having some spasm in the leg.   Objective: Vital signs in last 24 hours: Temp:  [97.4 F (36.3 C)-98.5 F (36.9 C)] 98.5 F (36.9 C) (02/21 0610) Pulse Rate:  [49-86] 86 (02/21 0610) Resp:  [10-20] 19 (02/21 0610) BP: (100-140)/(59-82) 120/63 (02/21 0610) SpO2:  [93 %-100 %] 94 % (02/21 0610)  Intake/Output from previous day: 02/20 0701 - 02/21 0700 In: 1730 [P.O.:380; I.V.:1250; IV Piggyback:100] Out: 475 [Urine:400; Blood:75] Intake/Output this shift: No intake/output data recorded.  Exam:  Dorsiflexion/Plantar flexion intact  Labs: No results for input(s): HGB in the last 72 hours. No results for input(s): WBC, RBC, HCT, PLT in the last 72 hours. No results for input(s): NA, K, CL, CO2, BUN, CREATININE, GLUCOSE, CALCIUM in the last 72 hours. No results for input(s): LABPT, INR in the last 72 hours.  Assessment/Plan: Plan physical therapy and CPM machine today   Melissa [name] 04/25/2016, 8:17 AM

## 2016-04-25 NOTE — Progress Notes (Signed)
Physical Therapy Treatment Patient Details Name: Melissa [name] MRN: [id number] DOB: Nov 29, 1955 Today's Date: 04/25/2016    History of Present Illness Admitted for RTKA; WBAT; KI with amb;  has a past medical history of Arthritis; Fibroid (aug. 2010);  has a past surgical history that includes [place]; Hand surgery (2009); and right hand surgery (2013).    PT Comments    Session today focused on activity tolerance and assessing for orthostatic hypotension, given lightheadedness in am session; RN notified of orthostatics, which were as follows:     04/25/16 1552  Vital Signs  Patient Position (if appropriate) Orthostatic Vitals  Orthostatic Lying   BP- Lying 144/71  Pulse- Lying 82  Orthostatic Sitting  BP- Sitting 146/72  Pulse- Sitting 99  Orthostatic Standing at 0 minutes  BP- Standing at 0 minutes 94/67  Pulse- Standing at 0 minutes 108      Follow Up Recommendations  Home health PT;Supervision/Assistance - 24 hour     Equipment Recommendations  Rolling walker with 5" wheels;3in1 (PT)    Recommendations for Other Services OT consult     Precautions / Restrictions Precautions Precautions: Knee;Fall Precaution Booklet Issued: Yes (comment) Precaution Comments: Pt educated to not allow any pillow or bolster under knee for healing with optimal range of motion. , Orthostatic Required Braces or Orthoses: Knee Immobilizer - Right Restrictions RLE Weight Bearing: Weight bearing as tolerated    Mobility  Bed Mobility Overal bed mobility: Needs Assistance Bed Mobility: Supine to Sit;Sit to Supine     Supine to sit: Min assist Sit to supine: Min assist   General bed mobility comments: Cues for technqiue; min assist for RLE  Transfers Overall transfer level: Needs assistance Equipment used: Rolling walker (2 wheeled) Transfers: Sit to/from Stand Sit to Stand: Mod assist         General transfer comment: Mod assist to rise from low  bed  Ambulation/Gait Ambulation/Gait assistance: Min assist Ambulation Distance (Feet): 12 Feet Assistive device: Rolling walker (2 wheeled) Gait Pattern/deviations: Step-to pattern     General Gait Details: Held due to orthostatic hypotension   Stairs            Wheelchair Mobility    Modified Rankin (Stroke Patients Only)       Balance                                    Cognition Arousal/Alertness: Awake/alert Behavior During Therapy: WFL for tasks assessed/performed Overall Cognitive Status: Within Functional Limits for tasks assessed                      Exercises Total Joint Exercises Quad Sets: AROM;Left;10 reps Heel Slides: AAROM;Left;10 reps Straight Leg Raises: AAROM;Left;5 reps    General Comments General comments (skin integrity, edema, etc.): Noted pt with pallor after getting up from toilet; brought recliner to her; just in case she had orthostatic hypotension      Pertinent Vitals/Pain Pain Assessment: Faces Faces Pain Scale: Hurts even more Pain Location: R knee with ROM  Pain Descriptors / Indicators: Aching;Grimacing;Guarding Pain Intervention(s): Limited activity within patient's tolerance;Monitored during session    Home Living Family/patient expects to be discharged to:: Private residence Living Arrangements: Spouse/significant other Available Help at Discharge: Family;Available 24 hours/day Type of Home: House Home Access: Stairs to enter Entrance Stairs-Rails: None Home Layout: One level Home Equipment: None      Prior  Function Level of Independence: Independent          PT Goals (current goals can now be found in the care plan section) Acute Rehab PT Goals Patient Stated Goal: walk without pain PT Goal Formulation: With patient Time For Goal Achievement: 05/02/16 Potential to Achieve Goals: Good Progress towards PT goals: Progressing toward goals (slowly)    Frequency    7X/week      PT  Plan Current plan remains appropriate    Co-evaluation             End of Session Equipment Utilized During Treatment: Gait belt;Right knee immobilizer Activity Tolerance: Patient limited by fatigue Patient left: in bed;in CPM;with call bell/phone within reach Nurse Communication: Mobility status;Other (comment) (orthostatic) PT Visit Diagnosis: Pain;Difficulty in walking, not elsewhere classified (R26.2) Pain - Right/Left: Right Pain - part of body: Knee     Time: [id number] PT Time Calculation (min) (ACUTE ONLY): 33 min  Charges:  $Therapeutic Activity: 23-37 mins                    G Codes:       Melissa [name] May 25, 2016, 5:04 PM  Melissa [name], [place] Pager [phone] Office [phone]

## 2016-04-25 NOTE — Discharge Instructions (Signed)

## 2016-04-25 NOTE — Evaluation (Signed)
Physical Therapy Evaluation Patient Details Name: Melissa [name] MRN: [id number] DOB: [birth year] Today's Date: 04/25/2016   History of Present Illness  Admitted for RTKA; WBAT; KI with amb;  has a past medical history of Arthritis; Fibroid (aug. 2010);  has a past surgical history that includes [place]; Hand surgery (2009); and right hand surgery (2013).  Clinical Impression   Pt is s/p TKA resulting in the deficits listed below (see PT Problem List). Presenting with decr activity tolerance, possible orthostasis (though unable to obtain BPs this session); Pain is alos quite a limiting factor; Once pain control and activity tolerance improve, anticipate good progress;   Pt will benefit from skilled PT to increase their independence and safety with mobility to allow discharge to the venue listed below.      Follow Up Recommendations Home health PT;Supervision/Assistance - 24 hour    Equipment Recommendations  Rolling walker with 5" wheels;3in1 (PT)    Recommendations for Other Services OT consult     Precautions / Restrictions Precautions Precautions: Knee;Fall Precaution Booklet Issued: Yes (comment) Precaution Comments: Pt educated to not allow any pillow or bolster under knee for healing with optimal range of motion. , Possibly Orthostatic on eval Required Braces or Orthoses: Knee Immobilizer - Right Restrictions RLE Weight Bearing: Weight bearing as tolerated      Mobility  Bed Mobility Overal bed mobility: Needs Assistance Bed Mobility: Supine to Sit     Supine to sit: Min assist     General bed mobility comments: Cues for technqiue; min assist for RLE  Transfers Overall transfer level: Needs assistance Equipment used: Rolling walker (2 wheeled) Transfers: Sit to/from Stand Sit to Stand: Mod assist;Min assist         General transfer comment: Mod assist to rise from low bed; min assist to rise from 3in1 commode, using  armrests  Ambulation/Gait Ambulation/Gait assistance: Min assist Ambulation Distance (Feet): 12 Feet Assistive device: Rolling walker (2 wheeled) Gait Pattern/deviations: Step-to pattern     General Gait Details: Cues for gait sequence and to self-monitor for activity tolerance  Stairs            Wheelchair Mobility    Modified Rankin (Stroke Patients Only)       Balance                                             Pertinent Vitals/Pain Pain Assessment: Faces Faces Pain Scale: Hurts even more Pain Location: R knee with ROM  Pain Descriptors / Indicators: Aching;Grimacing;Guarding Pain Intervention(s): Monitored during session    Home Living Family/patient expects to be discharged to:: Private residence Living Arrangements: Spouse/significant other Available Help at Discharge: Family;Available 24 hours/day Type of Home: House Home Access: Stairs to enter Entrance Stairs-Rails: None Entrance Stairs-Number of Steps: 3 Home Layout: One level Home Equipment: None      Prior Function Level of Independence: Independent               Hand Dominance        Extremity/Trunk Assessment   Upper Extremity Assessment Upper Extremity Assessment: Defer to OT evaluation    Lower Extremity Assessment Lower Extremity Assessment: RLE deficits/detail RLE Deficits / Details: Grossly decr AROM and strength, limited by pain postop; flexion PROM to approx 55 with significant muscle guarding RLE: Unable to fully assess due to pain  Communication   Communication: No difficulties  Cognition Arousal/Alertness: Awake/alert Behavior During Therapy: WFL for tasks assessed/performed Overall Cognitive Status: Within Functional Limits for tasks assessed                      General Comments General comments (skin integrity, edema, etc.): Noted pt with pallor after getting up from toilet; brought recliner to her; just in case she had  orthostatic hypotension    Exercises     Assessment/Plan    PT Assessment Patient needs continued PT services  PT Problem List Decreased strength;Decreased range of motion;Decreased activity tolerance;Decreased balance;Decreased mobility;Decreased knowledge of use of DME;Decreased knowledge of precautions;Pain       PT Treatment Interventions DME instruction;Gait training;Stair training;Functional mobility training;Therapeutic activities;Therapeutic exercise;Patient/family education    PT Goals (Current goals can be found in the Care Plan section)  Acute Rehab PT Goals Patient Stated Goal: walk without pain PT Goal Formulation: With patient Time For Goal Achievement: 05/02/16 Potential to Achieve Goals: Good    Frequency 7X/week   Barriers to discharge        Co-evaluation               End of Session Equipment Utilized During Treatment: Gait belt;Right knee immobilizer Activity Tolerance: Patient limited by fatigue Patient left: in chair;with call bell/phone within reach;with family/visitor present Nurse Communication: Mobility status PT Visit Diagnosis: Pain;Difficulty in walking, not elsewhere classified (R26.2) Pain - Right/Left: Right Pain - part of body: Knee         Time: [id number] PT Time Calculation (min) (ACUTE ONLY): 32 min   Charges:   PT Evaluation $PT Eval Moderate Complexity: 1 Procedure PT Treatments $Therapeutic Activity: 8-22 mins   PT G Codes:         Colletta [place] 04/25/2016, 3:41 PM  Roney Marion, [place] Pager [phone] Office [phone]

## 2016-04-25 NOTE — Progress Notes (Signed)
Physical therapy alerted me to positive orthostatic hypotension and dizziness upon patient standing during therapy today. Called Dr. Marlou Sa to notify and left my call back number on his voicemail.

## 2016-04-26 ENCOUNTER — Telehealth (INDEPENDENT_AMBULATORY_CARE_PROVIDER_SITE_OTHER): Payer: Self-pay | Admitting: Orthopedic Surgery

## 2016-04-26 LAB — HEMOGLOBIN AND HEMATOCRIT, BLOOD
HCT: 33.1 % — ABNORMAL LOW (ref 36.0–46.0)
HEMOGLOBIN: 10.9 g/dL — AB (ref 12.0–15.0)

## 2016-04-26 MED ORDER — OXYCODONE HCL 5 MG PO TABS
5.0000 mg | ORAL_TABLET | ORAL | Status: DC | PRN
  Administered 2016-04-26 – 2016-04-27 (×4): 10 mg via ORAL
  Filled 2016-04-26 (×4): qty 2

## 2016-04-26 NOTE — Telephone Encounter (Signed)
Patient's husband (terry) called asked if Rx can be called into [facility] in [place]. The number to contact Coralyn Mark is [phone] or [phone]

## 2016-04-26 NOTE — Progress Notes (Signed)
Physical Therapy Treatment Patient Details Name: Melissa [name] MRN: [id number] DOB: 04-Sep-1955 Today's Date: 04/26/2016    History of Present Illness Admitted for RTKA; WBAT; KI with amb;  has a past medical history of Arthritis; Fibroid (aug. 2010);  has a past surgical history that includes [place]; Hand surgery (2009); and right hand surgery (2013).    PT Comments    Patient continues to be positive for orthostatic hypotension limiting mobility. BP lying 134/69, sitting 130/90, standing 103/52 and at end of session 122/64.  Pt requires assistance for all mobility and unable to ambulate more than 10-12 ft without needing to sit down. Recommending [facility] for further skilled PT services to maximize independence and safety with mobility prior to d/c home.    Follow Up Recommendations  SNF;Supervision/Assistance - 24 hour     Equipment Recommendations  Rolling walker with 5" wheels;3in1 (PT)    Recommendations for Other Services OT consult     Precautions / Restrictions Precautions Precautions: Knee;Fall Precaution Comments: Orthostatic Required Braces or Orthoses: Knee Immobilizer - Right Restrictions Weight Bearing Restrictions: Yes RLE Weight Bearing: Weight bearing as tolerated    Mobility  Bed Mobility Overal bed mobility: Needs Assistance Bed Mobility: Supine to Sit     Supine to sit: Min assist     General bed mobility comments: Cues for technqiue; min assist for RLE and elevating trunk into sitting  Transfers Overall transfer level: Needs assistance Equipment used: Rolling walker (2 wheeled) Transfers: Sit to/from Stand Sit to Stand: Mod assist         General transfer comment: from partially elevated bed. cues for technique and hand placement. Assist to powerup.  Ambulation/Gait Ambulation/Gait assistance: Min assist Ambulation Distance (Feet): 10 Feet Assistive device: Rolling walker (2 wheeled) Gait Pattern/deviations: Step-to  pattern;Decreased stance time - right;Decreased step length - left;Decreased stride length;Decreased weight shift to right     General Gait Details: cues for sequencing and proximity of RW; assist to steady and for safety due to orthostatic   Stairs            Wheelchair Mobility    Modified Rankin (Stroke Patients Only)       Balance Overall balance assessment: Needs assistance Sitting-balance support: Single extremity supported;Feet supported Sitting balance-Leahy Scale: Fair     Standing balance support: Bilateral upper extremity supported Standing balance-Leahy Scale: Poor                      Cognition Arousal/Alertness: Awake/alert Behavior During Therapy: WFL for tasks assessed/performed Overall Cognitive Status: Within Functional Limits for tasks assessed                 General Comments: pt very anxious about mobility    Exercises Total Joint Exercises Heel Slides: AAROM;Left;10 reps    General Comments General comments (skin integrity, edema, etc.): husband present      Pertinent Vitals/Pain Pain Assessment: 0-10 Pain Score: 7  Pain Location: R knee at rest Pain Descriptors / Indicators: Aching;Grimacing;Guarding Pain Intervention(s): Limited activity within patient's tolerance;Monitored during session;Premedicated before session;Repositioned;Utilized relaxation techniques;Ice applied    Home Living Family/patient expects to be discharged to:: Private residence Living Arrangements: Spouse/significant other Available Help at Discharge: Family;Available 24 hours/day Type of Home: House Home Access: Stairs to enter Entrance Stairs-Rails: None Home Layout: One level Home Equipment: None Additional Comments: spouse reports he is not able to physical assist much    Prior Function Level of Independence: Independent  PT Goals (current goals can now be found in the care plan section) Acute Rehab PT Goals Patient Stated Goal:  walk without pain; d/c to home Progress towards PT goals: Not progressing toward goals - comment    Frequency    7X/week      PT Plan Discharge plan needs to be updated    Co-evaluation PT/OT/SLP Co-Evaluation/Treatment: Yes Reason for Co-Treatment: For patient/therapist safety;Complexity of the patient's impairments (multi-system involvement) PT goals addressed during session: Mobility/safety with mobility;Strengthening/ROM OT goals addressed during session: ADL's and self-care     End of Session Equipment Utilized During Treatment: Gait belt;Right knee immobilizer Activity Tolerance: Patient limited by fatigue;Other (comment) (BP) Patient left: in chair;with call bell/phone within reach;with family/visitor present Nurse Communication: Mobility status PT Visit Diagnosis: Pain;Difficulty in walking, not elsewhere classified (R26.2) Pain - Right/Left: Right Pain - part of body: Knee     Time: [id number] PT Time Calculation (min) (ACUTE ONLY): 33 min  Charges:  $Gait Training: 8-22 mins                    G Codes:       Salina April, PTA Pager: [phone]   04/26/2016, 11:59 AM

## 2016-04-26 NOTE — Progress Notes (Signed)
Physical Therapy Treatment Patient Details Name: Melissa [name] MRN: [id number] DOB: 02-28-1956 Today's Date: 04/26/2016    History of Present Illness Admitted for RTKA; WBAT; KI with amb;  has a past medical history of Arthritis; Fibroid (aug. 2010);  has a past surgical history that includes [place]; Hand surgery (2009); and right hand surgery (2013).    PT Comments    Patient is mobilizing with less difficulty this pm. Still with soft BP however asymptomatic. Pt required less assist to transfer this session. Continue to progress as tolerated.    Follow Up Recommendations  SNF;Supervision/Assistance - 24 hour     Equipment Recommendations  Rolling walker with 5" wheels;3in1 (PT)    Recommendations for Other Services OT consult     Precautions / Restrictions Precautions Precautions: Knee;Fall Precaution Comments: watch BP Restrictions Weight Bearing Restrictions: Yes RLE Weight Bearing: Weight bearing as tolerated    Mobility  Bed Mobility               General bed mobility comments: pt OOB in chair upon arrival  Transfers Overall transfer level: Needs assistance Equipment used: Rolling walker (2 wheeled) Transfers: Sit to/from Stand Sit to Stand: Min assist         General transfer comment: X3 from recliner; assist to steady when transitioning hand placement; cues for hand placement with carry over demonstrated  Ambulation/Gait Ambulation/Gait assistance: Min assist Ambulation Distance (Feet): 35 Feet Assistive device: Rolling walker (2 wheeled) Gait Pattern/deviations: Decreased stance time - right;Decreased step length - left;Decreased weight shift to right;Step-to pattern Gait velocity: decreased   General Gait Details: assist to manage RW with mod cues for sequencing of gait with safe use of AD, posture, and R quad activation during stance phase   Stairs            Wheelchair Mobility    Modified Rankin (Stroke Patients  Only)       Balance Overall balance assessment: Needs assistance Sitting-balance support: Single extremity supported;Feet supported Sitting balance-Leahy Scale: Fair     Standing balance support: Bilateral upper extremity supported Standing balance-Leahy Scale: Poor                      Cognition Arousal/Alertness: Awake/alert Behavior During Therapy: WFL for tasks assessed/performed Overall Cognitive Status: Within Functional Limits for tasks assessed                      Exercises Total Joint Exercises Quad Sets: AROM;Left;10 reps Heel Slides: AAROM;Left;10 reps Goniometric ROM: approx 5-65    General Comments General comments (skin integrity, edema, etc.): husband present; HEP handout given; BP soft but pt asymptomatic      Pertinent Vitals/Pain Pain Assessment: Faces Faces Pain Scale: Hurts little more Pain Location: R knee Pain Descriptors / Indicators: Guarding;Sore Pain Intervention(s): Limited activity within patient's tolerance;Monitored during session;Premedicated before session;Repositioned    Home Living                      Prior Function            PT Goals (current goals can now be found in the care plan section) Acute Rehab PT Goals Patient Stated Goal: walk without pain; d/c to home Progress towards PT goals: Progressing toward goals    Frequency    7X/week      PT Plan Current plan remains appropriate    Co-evaluation  End of Session Equipment Utilized During Treatment: Gait belt Activity Tolerance: Patient tolerated treatment well Patient left: in chair;with call bell/phone within reach;with family/visitor present Nurse Communication: Mobility status PT Visit Diagnosis: Pain;Difficulty in walking, not elsewhere classified (R26.2) Pain - Right/Left: Right Pain - part of body: Knee     Time: [id number] PT Time Calculation (min) (ACUTE ONLY): 36 min  Charges:  $Gait Training: 8-22  mins $Therapeutic Exercise: 8-22 mins                    G Codes:       Salina April, PTA Pager: [phone]   04/26/2016, 4:04 PM

## 2016-04-26 NOTE — Telephone Encounter (Signed)
IC LMVM [phone] to call us back.  I do not know what prescription he is referring to?

## 2016-04-26 NOTE — NC FL2 (Signed)
[place] LEVEL OF CARE SCREENING TOOL     IDENTIFICATION  Patient Name: Melissa [name] Birthdate: 11/21/1955 Sex: female Admission Date (Current Location): 04/24/2016  [facility] and [place] Number:  Herbalist and Address:  [facility] [employer], [place] [address], [place], [place] [address]      Provider Number: [id number]  Attending Physician Name and Address:  Meredith Pel, MD  Relative Name and Phone Number:       Current Level of Care: Hospital Recommended Level of Care: [place] Prior Approval Number:    Date Approved/Denied:   PASRR Number: [id number]  Discharge Plan: SNF    Current Diagnoses: Patient Active Problem List   Diagnosis Date Noted  . Arthritis of knee 04/24/2016  . GERD (gastroesophageal reflux disease) 03/12/2016  . Spondylosis without myelopathy or radiculopathy, lumbar region 02/23/2016  . Anxiety state, unspecified 06/17/2013  . Routine general medical examination at a health care facility 11/15/2011  . Dysphagia 11/09/2010  . DIARRHEA 11/10/2009  . KNEE PAIN, RIGHT 08/26/2009  . COUGH 12/21/2008  . TINNITUS, RIGHT 07/30/2007  . INSOMNIA 07/30/2007  . Leukocytopenia 07/29/2007  . ALLERGIC RHINITIS 07/29/2007  . BRONCHITIS 07/29/2007  . SCOLIOSIS 07/29/2007  . IRRITABLE BOWEL SYNDROME, HX OF 07/29/2007  . Osteoporosis 07/24/2007    Orientation RESPIRATION BLADDER Height & Weight     Time, Situation, Place, Self  Normal Continent Weight: 161 lb (73 kg) Height:     BEHAVIORAL SYMPTOMS/MOOD NEUROLOGICAL BOWEL NUTRITION STATUS      Continent  (Please see discharge summary)  AMBULATORY STATUS COMMUNICATION OF NEEDS Skin   Limited Assist Verbally Surgical wounds (Closed incision right knee, compression wrapped)                       Personal Care Assistance Level of Assistance  Bathing, Feeding, Dressing Bathing Assistance: Limited assistance Feeding assistance:  Independent Dressing Assistance: Limited assistance     Functional Limitations Info  Sight, Speech, Hearing Sight Info: Adequate Hearing Info: Impaired Speech Info: Adequate    SPECIAL CARE FACTORS FREQUENCY  PT (By licensed PT), OT (By licensed OT)     PT Frequency: 7x week OT Frequency: 7x week            Contractures Contractures Info: Not present    Additional Factors Info  Code Status, Allergies Code Status Info: Full Code Allergies Info: Ampicillin           Current Medications (04/26/2016):  This is the current hospital active medication list Current Facility-Administered Medications  Medication Dose Route Frequency Provider Last Rate Last Dose  . acetaminophen (TYLENOL) tablet 650 mg  650 mg Oral Q6H PRN Meredith Pel, MD   650 mg at 04/25/16 1911   Or  . acetaminophen (TYLENOL) suppository 650 mg  650 mg Rectal Q6H PRN Meredith Pel, MD      . calcium-vitamin D (OSCAL WITH D) 500-200 MG-UNIT per tablet 1 tablet  1 tablet Oral Q breakfast Meredith Pel, MD   1 tablet at 04/25/16 [phone]  . menthol-cetylpyridinium (CEPACOL) lozenge 3 mg  1 lozenge Oral PRN Meredith Pel, MD       Or  . phenol [facility]) mouth spray 1 spray  1 spray Mouth/Throat PRN Meredith Pel, MD      . metoCLOPramide (REGLAN) tablet 5-10 mg  5-10 mg Oral Q8H PRN Meredith Pel, MD       Or  .  metoCLOPramide (REGLAN) injection 5-10 mg  5-10 mg Intravenous Q8H PRN Meredith Pel, MD      . montelukast (SINGULAIR) tablet 10 mg  10 mg Oral QPM Meredith Pel, MD   10 mg at 04/25/16 1742  . morphine 4 MG/ML injection 4 mg  4 mg Intravenous Q3H PRN Meredith Pel, MD   4 mg at 04/24/16 1510  . multivitamin with minerals tablet 1 tablet  1 tablet Oral Daily Meredith Pel, MD   1 tablet at 04/25/16 0946  . ondansetron (ZOFRAN) tablet 4 mg  4 mg Oral Q6H PRN Meredith Pel, MD       Or  . ondansetron [facility]) injection 4 mg  4 mg Intravenous Q6H PRN  Meredith Pel, MD      . oxyCODONE (Oxy IR/ROXICODONE) immediate release tablet 5-10 mg  5-10 mg Oral Q4H PRN Meredith Pel, MD      . pantoprazole (PROTONIX) EC tablet 40 mg  40 mg Oral Daily Meredith Pel, MD   40 mg at 04/26/16 0905  . rivaroxaban (XARELTO) tablet 10 mg  10 mg Oral Q breakfast Meredith Pel, MD   10 mg at 04/26/16 0804  . tiZANidine (ZANAFLEX) tablet 4 mg  4 mg Oral Q8H PRN Meredith Pel, MD   4 mg at 04/26/16 1208  . venlafaxine XR (EFFEXOR-XR) 24 hr capsule 150 mg  150 mg Oral Daily Meredith Pel, MD   150 mg at 04/26/16 [id number]  . zolpidem (AMBIEN) tablet 5 mg  5 mg Oral QHS PRN Meredith Pel, MD         Discharge Medications: Please see discharge summary for a list of discharge medications.  Relevant Imaging Results:  Relevant Lab Results:   Additional Information SSN: [id number]  Alla German, LCSW

## 2016-04-26 NOTE — Care Management Note (Signed)
Case Management Note  Patient Details  Name: KARIELLE RASMUSSON MRN: [id number] Date of Birth: [birth year]  Subjective/Objective:    61 y.o. F admitted with R TKA originally thought to return home with [facility] NOW pt recommending SNF. CSW WILL be following for disposition.                 Action/Plan:CM will sign off for now but will be available should additional discharge needs arise or disposition change.    Expected Discharge Date:                  Expected Discharge Plan:  Skilled Nursing Facility  In-House Referral:  Clinical Social Work  Discharge planning Services  CM Consult  Post Acute Care Choice:  NA Choice offered to:  Patient  DME Arranged:  CPM DME Agency:  Other - Comment ([facility])  HH Arranged:  NA HH Agency:     Status of Service:  Completed, signed off  If discussed at [place] of Stay Meetings, dates discussed:    Additional Comments:  Delrae Sawyers, RN 04/26/2016, 4:26 PM

## 2016-04-26 NOTE — Evaluation (Signed)
Occupational Therapy Evaluation Patient Details Name: Melissa [name] MRN: [id number] DOB: June 08, 1955 Today's Date: 04/26/2016    History of Present Illness Admitted for RTKA; WBAT; KI with amb;  has a past medical history of Arthritis; Fibroid (aug. 2010);  has a past surgical history that includes [place]; Hand surgery (2009); and right hand surgery (2013).   Clinical Impression   Pt admitted with the above diagnoses and presents with below problem list. Pt will benefit from continued acute OT to address the below listed deficits and maximize independence with basic ADLs prior to d/c to venue below. PTA pt was independent with ADLs. Pt is currently mod-max A with LB ADLs, mod A with functional transfers, min A with in-room functional mobility (chair follow utilized). Pt positive and symptomatic for orthostatic hypotension. BP lying 134/69, sitting 130/90, standing 103/52 and at end of session 122/64. Will follow acutely and update recommendations as needed.     Follow Up Recommendations  SNF;Supervision/Assistance - 24 hour    Equipment Recommendations  3 in 1 bedside commode    Recommendations for Other Services       Precautions / Restrictions Precautions Precautions: Knee;Fall Precaution Comments: Orthostatic Required Braces or Orthoses: Knee Immobilizer - Right Restrictions Weight Bearing Restrictions: Yes RLE Weight Bearing: Weight bearing as tolerated      Mobility Bed Mobility Overal bed mobility: Needs Assistance Bed Mobility: Supine to Sit     Supine to sit: Min assist     General bed mobility comments: Cues for technqiue; min assist for RLE  Transfers Overall transfer level: Needs assistance Equipment used: Rolling walker (2 wheeled) Transfers: Sit to/from Stand Sit to Stand: Mod assist         General transfer comment: from partially elevated bed. cues for technique and hand placement. Assist to powerup.    Balance Overall balance  assessment: Needs assistance Sitting-balance support: Single extremity supported;Feet supported Sitting balance-Leahy Scale: Fair     Standing balance support: Bilateral upper extremity supported Standing balance-Leahy Scale: Poor                              ADL Overall ADL's : Needs assistance/impaired Eating/Feeding: Set up;Sitting   Grooming: Set up;Sitting Grooming Details (indicate cue type and reason): extra time due to single UE support in sitting position Upper Body Bathing: Minimal assistance;Sitting   Lower Body Bathing: Maximal assistance;Sit to/from stand   Upper Body Dressing : Minimal assistance   Lower Body Dressing: Maximal assistance;Sit to/from stand   Toilet Transfer: Moderate assistance;Ambulation;RW   Toileting- Clothing Manipulation and Hygiene: Moderate assistance       Functional mobility during ADLs: Minimal assistance;Rolling walker (chair follow due to orthostatic) General ADL Comments: Pt completed bed mobility and ambulated around bed to recliner with chair follow and min A during ambulation. Positive orthostatic in standing.      Vision         Perception     Praxis      Pertinent Vitals/Pain Pain Assessment: 0-10 Pain Score: 7  Pain Location: R knee at rest Pain Descriptors / Indicators: Aching;Grimacing;Guarding Pain Intervention(s): Limited activity within patient's tolerance;Monitored during session;Premedicated before session;Repositioned;Ice applied;Utilized relaxation techniques     Hand Dominance     Extremity/Trunk Assessment Upper Extremity Assessment Upper Extremity Assessment: RUE deficits/detail;Generalized weakness RUE Deficits / Details: H/o R hand surgery   Lower Extremity Assessment Lower Extremity Assessment: Defer to PT evaluation  Communication Communication Communication: No difficulties   Cognition Arousal/Alertness: Awake/alert Behavior During Therapy: WFL for tasks  assessed/performed Overall Cognitive Status: Within Functional Limits for tasks assessed                     General Comments       Exercises       Shoulder Instructions      Home Living Family/patient expects to be discharged to:: Private residence Living Arrangements: Spouse/significant other Available Help at Discharge: Family;Available 24 hours/day Type of Home: House Home Access: Stairs to enter [employer] of Steps: 3 Entrance Stairs-Rails: None Home Layout: One level               Home Equipment: None   Additional Comments: spouse reports he is not able to physical assist much      Prior Functioning/Environment Level of Independence: Independent                 OT Problem List: Decreased activity tolerance;Impaired balance (sitting and/or standing);Decreased knowledge of use of DME or AE;Decreased knowledge of precautions;Cardiopulmonary status limiting activity;Pain      OT Treatment/Interventions: Self-care/ADL training;DME and/or AE instruction;Therapeutic activities;Patient/family education;Balance training    OT Goals(Current goals can be found in the care plan section) Acute Rehab OT Goals Patient Stated Goal: walk without pain; d/c to home OT Goal Formulation: With patient/family Time For Goal Achievement: 05/03/16 Potential to Achieve Goals: Good ADL Goals Pt Will Perform Lower Body Bathing: sit to/from stand;with min guard assist Pt Will Perform Lower Body Dressing: with min guard assist;sit to/from stand Pt Will Transfer to Toilet: with min guard assist;ambulating Pt Will Perform Toileting - Clothing Manipulation and hygiene: with min guard assist;sit to/from stand Pt Will Perform Tub/Shower Transfer: with min guard assist;rolling walker;3 in 1 Additional ADL Goal #1: Pt will complete bed mobility at supervision level to prepare for OOB ADLs.   OT Frequency: Min 2X/week   Barriers to D/C:    spouse reports he cannot  provide much physical assist       Co-evaluation PT/OT/SLP Co-Evaluation/Treatment: Yes Reason for Co-Treatment: For patient/therapist safety;Complexity of the patient's impairments (multi-system involvement);Other (comment) (orthostatic)   OT goals addressed during session: ADL's and self-care      End of Session Equipment Utilized During Treatment: Gait belt;Rolling walker;Right knee immobilizer Nurse Communication: Other (comment) (+ orthostatic)  Activity Tolerance: Other (comment) (orthostatic, dizzy OOB) Patient left: in chair;with call bell/phone within reach;with family/visitor present  OT Visit Diagnosis: Pain;Unsteadiness on feet (R26.81) Pain - Right/Left: Right Pain - part of body: Knee                ADL either performed or assessed with clinical judgement  Time: 1013-1045 OT Time Calculation (min): 32 min Charges:  OT General Charges $OT Visit: 1 Procedure OT Evaluation $OT Eval Moderate Complexity: 1 Procedure G-Codes:     Hortencia Pilar 05/09/16, 11:16 AM

## 2016-04-26 NOTE — Telephone Encounter (Signed)
Can you followup on this Friday?

## 2016-04-26 NOTE — Progress Notes (Signed)
Subjective: Patient doing recently well.  This morning she was sitting in the chair.  Incision dressing is dry.   Objective: Vital signs in last 24 hours: Temp:  [98.6 F (37 C)-99.9 F (37.7 C)] 98.6 F (37 C) (02/22 0732) Pulse Rate:  [77-90] 90 (02/22 1000) Resp:  [16-18] 16 (02/22 0732) BP: (103-135)/(55-67) 122/64 (02/22 1000) SpO2:  [94 %-99 %] 94 % (02/22 1000)  Intake/Output from previous day: 02/21 0701 - 02/22 0700 In: 360 [P.O.:360] Out: -  Intake/Output this shift: No intake/output data recorded.  Exam:  Dorsiflexion/Plantar flexion intact  Labs: No results for input(s): HGB in the last 72 hours. No results for input(s): WBC, RBC, HCT, PLT in the last 72 hours. No results for input(s): NA, K, CL, CO2, BUN, CREATININE, GLUCOSE, CALCIUM in the last 72 hours. No results for input(s): LABPT, INR in the last 72 hours.  Assessment/Plan: Plan is for rehabilitation today and tomorrow morning.  Anticipate discharge to home tomorrow.  She's been having a little bit of dizziness when she is standing.  We will check a CBC.  She has been drinking well.   Melissa [name] 04/26/2016, 2:13 PM

## 2016-04-27 MED ORDER — OXYCODONE HCL 5 MG PO TABS: 5.0000 mg | ORAL_TABLET | ORAL | 0 refills | Status: DC | PRN

## 2016-04-27 MED ORDER — RIVAROXABAN 10 MG PO TABS: 10.0000 mg | ORAL_TABLET | Freq: Every day | ORAL | 0 refills | Status: DC

## 2016-04-27 NOTE — Telephone Encounter (Signed)
I spoke with Melissa [name]. He states the scripts are taken care of already. He just wants her pharmacy changed in system. Pharmacy changed.

## 2016-04-27 NOTE — Progress Notes (Signed)
Physical Therapy Treatment Patient Details Name: Melissa [name] MRN: [id number] DOB: [birth year] Today's Date: 04/27/2016    History of Present Illness Admitted for RTKA; WBAT; KI with amb;  has a past medical history of Arthritis; Fibroid (aug. 2010);  has a past surgical history that includes [place]; Hand surgery (2009); and right hand surgery (2013).    PT Comments    Patient has made great progress with mobility over the past 24 hours and overal min guard/supervision for mobility. Pt with no c/o dizziness and no pallor this session. Husband present throughout session. Due to pt's progression with mobility recommending HHPT for further skilled PT services to maximize independence with mobility, strength, and ROM.    Follow Up Recommendations  Supervision/Assistance - 24 hour;Home health PT     Equipment Recommendations  Rolling walker with 5" wheels;3in1 (PT)    Recommendations for Other Services OT consult     Precautions / Restrictions Precautions Precautions: Knee;Fall Restrictions Weight Bearing Restrictions: Yes RLE Weight Bearing: Weight bearing as tolerated    Mobility  Bed Mobility Overal bed mobility: Needs Assistance Bed Mobility: Supine to Sit     Supine to sit: Min guard     General bed mobility comments: cues for technique; increased time/effort adn min guard for safety; no physical assist required  Transfers Overall transfer level: Needs assistance Equipment used: Rolling walker (2 wheeled) Transfers: Sit to/from Stand Sit to Stand: Min guard         General transfer comment: from EOB, recliner, and BSC; cues for hand placement with carry over demonstrated; min guard for safety but no physical assist required  Ambulation/Gait Ambulation/Gait assistance: Min guard;Supervision Ambulation Distance (Feet): 200 Feet Assistive device: Rolling walker (2 wheeled) Gait Pattern/deviations: Decreased stance time - right;Decreased step length -  left;Decreased weight shift to right;Step-through pattern Gait velocity: decreased   General Gait Details: cues for sequencing, R heel strike, and posture; pt with improved gait mechanics and step through pattern   Stairs Stairs: Yes   Stair Management: No rails;Step to pattern;Backwards;With walker Number of Stairs: 4 General stair comments: cues for sequnecing and technique with therapist demonstration prior to practice; husband stabilized RW  Wheelchair Mobility    Modified Rankin (Stroke Patients Only)       Balance Overall balance assessment: Needs assistance Sitting-balance support: Feet supported Sitting balance-Leahy Scale: Good     Standing balance support: Bilateral upper extremity supported Standing balance-Leahy Scale: Poor                      Cognition Arousal/Alertness: Awake/alert Behavior During Therapy: WFL for tasks assessed/performed Overall Cognitive Status: Within Functional Limits for tasks assessed                      Exercises Total Joint Exercises Quad Sets: AROM;Right;5 reps Short Arc Quad: AROM;AAROM;Right;10 reps Heel Slides: AAROM;10 reps;AROM;Right Goniometric ROM: 0-80    General Comments General comments (skin integrity, edema, etc.): husband present; reviewed HEP handout      Pertinent Vitals/Pain Pain Assessment: 0-10 Pain Score: 4  Pain Location: R knee Pain Descriptors / Indicators: Sore Pain Intervention(s): Monitored during session;Premedicated before session;Repositioned    Home Living                      Prior Function            PT Goals (current goals can now be found in the care plan section) Acute  Rehab PT Goals Patient Stated Goal: walk without pain; d/c to home Progress towards PT goals: Progressing toward goals    Frequency    7X/week      PT Plan Discharge plan needs to be updated    Co-evaluation             End of Session Equipment Utilized During Treatment:  Gait belt Activity Tolerance: Patient tolerated treatment well Patient left: in chair;with call bell/phone within reach;with family/visitor present Nurse Communication: Mobility status PT Visit Diagnosis: Pain;Difficulty in walking, not elsewhere classified (R26.2) Pain - Right/Left: Right Pain - part of body: Knee     Time: [id number] PT Time Calculation (min) (ACUTE ONLY): 41 min  Charges:  $Gait Training: 8-22 mins $Therapeutic Exercise: 8-22 mins $Therapeutic Activity: 8-22 mins                    G Codes:       [place]ina April, PTA Pager: [phone]   04/27/2016, 9:34 AM

## 2016-04-27 NOTE — Progress Notes (Signed)
Subjective: Pt stable - doing ok   Objective: Vital signs in last 24 hours: Temp:  [97.7 F (36.5 C)-98.9 F (37.2 C)] 98.4 F (36.9 C) (02/23 0535) Pulse Rate:  [90-106] 95 (02/23 0535) Resp:  [16] 16 (02/23 0535) BP: (83-133)/(42-64) 133/63 (02/23 0535) SpO2:  [94 %-100 %] 100 % (02/23 0535)  Intake/Output from previous day: No intake/output data recorded. Intake/Output this shift: No intake/output data recorded.  Exam:  Dorsiflexion/Plantar flexion intact  Labs:  Recent Labs  04/26/16 1510  HGB 10.9*    Recent Labs  04/26/16 1510  HCT 33.1*   No results for input(s): NA, K, CL, CO2, BUN, CREATININE, GLUCOSE, CALCIUM in the last 72 hours. No results for input(s): LABPT, INR in the last 72 hours.  Assessment/Plan: Plan dc to home today   G Alphonzo Severance 04/27/2016, 8:34 AM

## 2016-04-27 NOTE — Care Management Note (Signed)
Case Management Note  Patient Details  Name: Melissa [name] MRN: [id number] Date of Birth: 12-25-1955  Subjective/Objective:   61 yr old female s/p right total knee arthroplasty.       Action/Plan: Case manager spoke with patient and her husband concerning Home health and DME needs. Patient was preoperatively setup with [facility], no changes. Per patient's husband the CPM will be delivered to their home today by someone from [place]. CM has ordered rolling walker and 3in1.    Expected Discharge Date:  04/27/16               Expected Discharge Plan:  [place]  In-House Referral:     Discharge planning Services  CM Consult  Post Acute Care Choice:  NA, Home Health Choice offered to:  Patient, Spouse  DME Arranged:  CPM DME Agency:  Other - Comment ([facility])  HH Arranged:  NA, PT HH Agency:     Status of Service:  Completed, signed off  If discussed at [place] of Stay Meetings, dates discussed:    Additional Comments:  Ninfa Meeker, RN 04/27/2016, 11:17 AM

## 2016-04-27 NOTE — Progress Notes (Signed)
Pt discharged from unit to [facility] home. Husband with pt. Pt voices no c/o at this time and demonstrates no s/sx of distress. All discharge instructions reviewed with pt as rx also reviewed. DME to be delivered to home.

## 2016-04-30 ENCOUNTER — Telehealth (INDEPENDENT_AMBULATORY_CARE_PROVIDER_SITE_OTHER): Payer: Self-pay | Admitting: *Deleted

## 2016-04-30 NOTE — Telephone Encounter (Signed)
[facility] called for verbal orders of 3x a week for 2 weeks for home health Physical Therapy

## 2016-04-30 NOTE — Telephone Encounter (Signed)
IC and advised orders are ok. 

## 2016-05-07 ENCOUNTER — Ambulatory Visit (INDEPENDENT_AMBULATORY_CARE_PROVIDER_SITE_OTHER): Payer: Self-pay

## 2016-05-07 ENCOUNTER — Ambulatory Visit (INDEPENDENT_AMBULATORY_CARE_PROVIDER_SITE_OTHER): Payer: BLUE CROSS/BLUE SHIELD | Admitting: Orthopedic Surgery

## 2016-05-07 ENCOUNTER — Encounter (INDEPENDENT_AMBULATORY_CARE_PROVIDER_SITE_OTHER): Payer: Self-pay | Admitting: Orthopedic Surgery

## 2016-05-07 DIAGNOSIS — Z96651 Presence of right artificial knee joint: Secondary | ICD-10-CM | POA: Diagnosis not present

## 2016-05-07 MED ORDER — OXYCODONE HCL 5 MG PO TABS: 5.0000 mg | ORAL_TABLET | Freq: Three times a day (TID) | ORAL | 0 refills | Status: DC | PRN

## 2016-05-07 NOTE — Progress Notes (Signed)
   Post-Op Visit Note   Patient: Melissa [name]           Date of Birth: 24-Aug-1955           MRN: [id number] Visit Date: 05/07/2016 PCP: Renato Shin, MD   Assessment & Plan:  Chief Complaint:  Chief Complaint  Patient presents with  . Right Knee - Routine Post Op   Visit Diagnoses:  1. Presence of right artificial knee joint     Plan: Melissa [name] is a patient who is now 2 weeks out right total knee replacement.  She's doing well.  CPM machine is at 100.  On exam the incision is intact.  The refill her oxycodone.  She has flexion easily to 90.  She is using a walker.  Continue with home health physical therapy.  Start the bike.  Continue with ice.  Start outpatient PT as well.  4 week return for recheck on range of motion.  Follow-Up Instructions: No Follow-up on file.   Orders:  Orders Placed This Encounter  Procedures  . XR Knee 1-2 Views Right   No orders of the defined types were placed in this encounter.   Imaging: No results found.  PMFS History: Patient Active Problem List   Diagnosis Date Noted  . Presence of right artificial knee joint 05/07/2016  . Arthritis of knee 04/24/2016  . GERD (gastroesophageal reflux disease) 03/12/2016  . Spondylosis without myelopathy or radiculopathy, lumbar region 02/23/2016  . Anxiety state, unspecified 06/17/2013  . Routine general medical examination at a health care facility 11/15/2011  . Dysphagia 11/09/2010  . DIARRHEA 11/10/2009  . KNEE PAIN, RIGHT 08/26/2009  . COUGH 12/21/2008  . TINNITUS, RIGHT 07/30/2007  . INSOMNIA 07/30/2007  . Leukocytopenia 07/29/2007  . ALLERGIC RHINITIS 07/29/2007  . BRONCHITIS 07/29/2007  . SCOLIOSIS 07/29/2007  . IRRITABLE BOWEL SYNDROME, HX OF 07/29/2007  . Osteoporosis 07/24/2007   Past Medical History:  Diagnosis Date  . Arthritis   . Fibroid aug. 2010  . GERD (gastroesophageal reflux disease)   . Infertility     Family History  Problem Relation Age of Onset  . Parkinsonism  Sister   . Lung cancer Mother   . Parkinson's disease Father     Past Surgical History:  Procedure Laterality Date  . HAND SURGERY  2009   LEFT  . [place] RODS     AGE 11  . right hand surgery  2013  . TOTAL KNEE ARTHROPLASTY Right 04/24/2016   Procedure: TOTAL KNEE ARTHROPLASTY;  Surgeon: Meredith Pel, MD;  Location: [facility];  Service: Orthopedics;  Laterality: Right;   Social History   Occupational History  . Not on file.   Social History Main Topics  . Smoking status: Never Smoker  . Smokeless tobacco: Never Used  . Alcohol use 4.2 oz/week    7 Glasses of wine per week     Comment: sometimes  . Drug use: No  . Sexual activity: Yes    Partners: Male    Birth control/ protection: Post-menopausal

## 2016-05-08 ENCOUNTER — Other Ambulatory Visit (INDEPENDENT_AMBULATORY_CARE_PROVIDER_SITE_OTHER): Payer: Self-pay | Admitting: Physical Medicine and Rehabilitation

## 2016-05-08 DIAGNOSIS — M47816 Spondylosis without myelopathy or radiculopathy, lumbar region: Secondary | ICD-10-CM

## 2016-05-08 NOTE — Telephone Encounter (Signed)
Please advise 

## 2016-05-18 ENCOUNTER — Telehealth (INDEPENDENT_AMBULATORY_CARE_PROVIDER_SITE_OTHER): Payer: Self-pay | Admitting: *Deleted

## 2016-05-18 NOTE — Telephone Encounter (Signed)
Patient called in this morning with some questions regarding her medications. She recently had surgery on Feb 20th she had a total knee replacement. She would like to know if she could go back on her Celebrex, Vitamin E and her Fish oil? Her CB # [phone]. Thank you

## 2016-05-18 NOTE — Telephone Encounter (Signed)
Can you please advise or hold for Dr Marlou Sa?

## 2016-05-18 NOTE — Telephone Encounter (Signed)
Left message for patient advising could resume meds.

## 2016-05-18 NOTE — Telephone Encounter (Signed)
Yes she can resume.

## 2016-05-23 NOTE — Discharge Summary (Signed)
Physician Discharge Summary  Patient ID: Melissa [name] MRN: [id number] DOB/AGE: 1956/02/05 61 y.o.  Admit date: 04/24/2016 Discharge date: 04/27/2016  Admission Diagnoses:  Active Problems:   Arthritis of knee   Discharge Diagnoses:  Same  Surgeries: Procedure(s): TOTAL KNEE ARTHROPLASTY on 04/24/2016   Consultants:   Discharged Condition: Stable  Hospital Course: Melissa [name] is an 61 y.o. female who was admitted 04/24/2016 with a chief complaint of knee pain, and found to have a diagnosis of knee arthritis.  They were brought to the operating room on 04/24/2016 and underwent the above named procedures.  She tolerated knee replacement well.  She was started on physical therapy day of surgery.  She mobilized well and was using the CPM machine.  She will continue on DVT prophylaxis and pain medicine.  She will follow-up with me in 10 days for clinical recheck  Antibiotics given:  Anti-infectives    Start     Dose/Rate Route Frequency Ordered Stop   04/24/16 1500  clindamycin (CLEOCIN) IVPB 600 mg     600 mg 100 mL/hr over 30 Minutes Intravenous Every 6 hours 04/24/16 1401 04/24/16 2302   04/24/16 0700  clindamycin (CLEOCIN) IVPB 900 mg     900 mg 100 mL/hr over 30 Minutes Intravenous To [facility]Stay [facility] 04/23/16 1157 04/24/16 0740    .  Recent vital signs:  Vitals:   04/26/16 2009 04/27/16 0535  BP: 132/64 133/63  Pulse: (!) 106 95  Resp: 16 16  Temp: 98.9 F (37.2 C) 98.4 F (36.9 C)    Recent laboratory studies:  Results for orders placed or performed during the hospital encounter of 04/24/16  Urine culture  Result Value Ref Range   Specimen Description URINE, CLEAN CATCH    Special Requests NONE    Culture NO GROWTH    Report Status 04/25/2016 FINAL   Urinalysis, Routine w reflex microscopic  Result Value Ref Range   Color, Urine STRAW (A) YELLOW   APPearance CLEAR CLEAR   Specific Gravity, Urine 1.013 1.005 - 1.030   pH 6.0 5.0 - 8.0   Glucose,  UA NEGATIVE NEGATIVE mg/dL   Hgb urine dipstick NEGATIVE NEGATIVE   Bilirubin Urine NEGATIVE NEGATIVE   Ketones, ur NEGATIVE NEGATIVE mg/dL   Protein, ur NEGATIVE NEGATIVE mg/dL   Nitrite NEGATIVE NEGATIVE   Leukocytes, UA NEGATIVE NEGATIVE  Hemoglobin and hematocrit, blood  Result Value Ref Range   Hemoglobin 10.9 (L) 12.0 - 15.0 g/dL   HCT 33.1 (L) 36.0 - 46.0 %    Discharge Medications:   Allergies as of 04/27/2016      Reactions   Ampicillin Rash   REACTION: Rash Has patient had a PCN reaction causing immediate rash, facial/tongue/throat swelling, SOB or lightheadedness with hypotension: unknown Has patient had a PCN reaction causing severe rash involving mucus membranes or skin necrosis: unknown  Has patient had a PCN reaction that required hospitalization No Has patient had a PCN reaction occurring within the last 10 years: No If all of the above answers are "NO", then may proceed with Cephalosporin use.      Medication List    STOP taking these medications   celecoxib 200 MG capsule Commonly known as:  CELEBREX   Fish Oil 1000 MG Caps   GLUCOSAMINE CHONDR COMPLEX PO   vitamin E 400 UNIT capsule     TAKE these medications   CALCIUM 1000 + D PO Take 1 tablet by mouth daily.   D3-1000 PO Take 1 tablet  by mouth daily.   fluticasone 50 MCG/ACT nasal spray Commonly known as:  FLONASE Place 2 sprays into both nostrils daily.   levocetirizine 5 MG tablet Commonly known as:  XYZAL Take 5 mg by mouth every evening.   Magnesium 400 MG Caps Take 400 mg by mouth daily.   montelukast 10 MG tablet Commonly known as:  SINGULAIR Take 10 mg by mouth every evening.   multivitamin tablet Take 1 tablet by mouth daily.   omeprazole 20 MG capsule Commonly known as:  PRILOSEC Take 1 capsule (20 mg total) by mouth daily.   oxyCODONE 5 MG immediate release tablet Commonly known as:  Oxy IR/ROXICODONE Take 1-2 tablets (5-10 mg total) by mouth every 4 (four) hours as  needed for breakthrough pain.   QNASL 80 MCG/ACT Aers Generic drug:  Beclomethasone Dipropionate Place 1 spray into the nose daily as needed (allergies).   rivaroxaban 10 MG Tabs tablet Commonly known as:  XARELTO Take 1 tablet (10 mg total) by mouth daily with breakfast.   tiZANidine 4 MG tablet Commonly known as:  ZANAFLEX TAKE 1/2 TO 1 TABLET BY MOUTH EVERY DAY AT BEDTIME   valACYclovir 500 MG tablet Commonly known as:  VALTREX Take one tablet twice daily for 3-5 days the prn as needed   venlafaxine XR 75 MG 24 hr capsule Commonly known as:  EFFEXOR-XR TAKE 2 CAPSULES BY MOUTH EVERY DAY What changed:  See the new instructions.   vitamin C 1000 MG tablet Take 1,000 mg by mouth daily.   zolpidem 5 MG tablet Commonly known as:  AMBIEN TAKE 1 TABLET BY MOUTH AT BEDTIME AS NEEDED FOR SLEEP What changed:  See the new instructions.       Diagnostic Studies: Dg Chest 2 View  Result Date: 04/24/2016 CLINICAL DATA:  61 year old female with preop evaluation for right knee replacement. EXAM: CHEST  2 VIEW COMPARISON:  Chest radiograph dated 11/09/2015 FINDINGS: The lungs are clear. There is no pleural effusion or pneumothorax. The cardiac silhouette is within normal limits. There is severe dextroscoliosis of the thoracic spine in similar to prior radiograph. A single thoracic spinal Harrington rod noted. No acute osseous pathology. IMPRESSION: 1. No acute cardiopulmonary process. 2. Stable thoracic dextroscoliosis. Electronically Signed   By: Anner Crete M.D.   On: 04/24/2016 06:45   Xr Knee 1-2 Views Right  Result Date: 05/09/2016 Right knee AP lateral reviewed.  Total knee prosthesis in good alignment and position.  No complicating features.  No fracture.  Patellar height and relation to the distal femur is normal.   Disposition: 01-Home or Self Care  Discharge Instructions    Call MD / Call 911    Complete by:  As directed    If you experience chest pain or shortness of  breath, CALL 911 and be transported to the hospital emergency room.  If you develope a fever above 101 F, pus (white drainage) or increased drainage or redness at the wound, or calf pain, call your surgeon's office.   Constipation Prevention    Complete by:  As directed    Drink plenty of fluids.  Prune juice may be helpful.  You may use a stool softener, such as Colace (over the counter) 100 mg twice a day.  Use MiraLax (over the counter) for constipation as needed.   Diet - low sodium heart healthy    Complete by:  As directed    Discharge instructions    Complete by:  As directed  cpm 5 - 6 hours per day Weight bearing as tolerated Remove dressing next Friday Ok to shower now and after dressing removed   Increase activity slowly as tolerated    Complete by:  As directed       Follow-up Information    [facility] Follow up.   Specialty:  [place] Why:  Someone from [facility] will contact you to arrange start date and time for therapy. Contact information: [address] [place] [address] [phone]            Signed: Anderson Malta 05/23/2016, 12:03 PM

## 2016-06-06 ENCOUNTER — Encounter (INDEPENDENT_AMBULATORY_CARE_PROVIDER_SITE_OTHER): Payer: Self-pay | Admitting: Orthopedic Surgery

## 2016-06-06 ENCOUNTER — Other Ambulatory Visit (INDEPENDENT_AMBULATORY_CARE_PROVIDER_SITE_OTHER): Payer: Self-pay | Admitting: Physical Medicine and Rehabilitation

## 2016-06-06 ENCOUNTER — Ambulatory Visit (INDEPENDENT_AMBULATORY_CARE_PROVIDER_SITE_OTHER): Payer: BLUE CROSS/BLUE SHIELD | Admitting: Orthopedic Surgery

## 2016-06-06 DIAGNOSIS — Z96651 Presence of right artificial knee joint: Secondary | ICD-10-CM

## 2016-06-06 DIAGNOSIS — M47816 Spondylosis without myelopathy or radiculopathy, lumbar region: Secondary | ICD-10-CM

## 2016-06-06 MED ORDER — CLINDAMYCIN HCL 300 MG PO CAPS
ORAL_CAPSULE | ORAL | 0 refills | Status: DC
Start: 2016-06-06 — End: 2018-07-21

## 2016-06-06 NOTE — Progress Notes (Signed)
   Post-Op Visit Note   Patient: Melissa [name]           Date of Birth: [birth year]           MRN: [id number] Visit Date: 06/06/2016 PCP: Renato Shin, MD   Assessment & Plan:  Chief Complaint:  Chief Complaint  Patient presents with  . Right Knee - Routine Post Op   Visit Diagnoses:  1. Presence of right artificial knee joint     Plan: [name] is a 61 year old female who underwent right total knee replacement 04/24/2016.  She's doing well with no problems.  Taking Tylenol for pain.  On exam she has full extension about 115 of flexion no effusion incision intact.  Patient's doing well.  She's been in [place] in [place].  She's getting colonoscopy in a month.  Will have her take some amoxicillin preoperatively for that.  I'll see her back as needed  Follow-Up Instructions: No Follow-up on file.   Orders:  No orders of the defined types were placed in this encounter.  Meds ordered this encounter  Medications  . clindamycin (CLEOCIN) 300 MG capsule    Sig: 2 tablets 1 hour prior to dental procedure    Dispense:  8 capsule    Refill:  0    Imaging: No results found.  PMFS History: Patient Active Problem List   Diagnosis Date Noted  . Presence of right artificial knee joint 05/07/2016  . Arthritis of knee 04/24/2016  . GERD (gastroesophageal reflux disease) 03/12/2016  . Spondylosis without myelopathy or radiculopathy, lumbar region 02/23/2016  . Anxiety state, unspecified 06/17/2013  . Routine general medical examination at a health care facility 11/15/2011  . Dysphagia 11/09/2010  . DIARRHEA 11/10/2009  . KNEE PAIN, RIGHT 08/26/2009  . COUGH 12/21/2008  . TINNITUS, RIGHT 07/30/2007  . INSOMNIA 07/30/2007  . Leukocytopenia 07/29/2007  . ALLERGIC RHINITIS 07/29/2007  . BRONCHITIS 07/29/2007  . SCOLIOSIS 07/29/2007  . IRRITABLE BOWEL SYNDROME, HX OF 07/29/2007  . Osteoporosis 07/24/2007   Past Medical History:  Diagnosis Date  . Arthritis   .  Fibroid aug. 2010  . GERD (gastroesophageal reflux disease)   . Infertility     Family History  Problem Relation Age of Onset  . Parkinsonism Sister   . Lung cancer Mother   . Parkinson's disease Father     Past Surgical History:  Procedure Laterality Date  . HAND SURGERY  2009   LEFT  . HARRINGTON RODS     AGE 22  . right hand surgery  2013  . TOTAL KNEE ARTHROPLASTY Right 04/24/2016   Procedure: TOTAL KNEE ARTHROPLASTY;  Surgeon: Meredith Pel, MD;  Location: [facility];  Service: Orthopedics;  Laterality: Right;   Social History   Occupational History  . Not on file.   Social History Main Topics  . Smoking status: Never Smoker  . Smokeless tobacco: Never Used  . Alcohol use 4.2 oz/week    7 Glasses of wine per week     Comment: sometimes  . Drug use: No  . Sexual activity: Yes    Partners: Male    Birth control/ protection: Post-menopausal

## 2016-06-07 NOTE — Telephone Encounter (Signed)
Please advise 

## 2016-06-07 NOTE — Telephone Encounter (Signed)
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

## 2016-06-08 NOTE — Telephone Encounter (Signed)
I called patient and she is still taking Xarelto. She said she has always taken the Xarelto and Celebrex together. Please advise.

## 2016-06-29 ENCOUNTER — Encounter: Payer: BLUE CROSS/BLUE SHIELD | Admitting: Gastroenterology

## 2016-07-03 ENCOUNTER — Telehealth: Payer: Self-pay

## 2016-07-03 NOTE — Telephone Encounter (Signed)
Called patient to ask if patient is taking Xarelto and how long she will be taking this medication since she is scheduled for procedures on 07/05/16. Spoke with patient's husband and he states patient has not been on Xarelto a few months now. He states patient only took it for 15 days after knee surgery in February. Informed him to tell patient to keep Endoscopy/Colonoscopy for 07/05/16.

## 2016-07-05 ENCOUNTER — Ambulatory Visit (AMBULATORY_SURGERY_CENTER): Payer: BLUE CROSS/BLUE SHIELD | Admitting: Gastroenterology

## 2016-07-05 ENCOUNTER — Encounter: Payer: Self-pay | Admitting: Gastroenterology

## 2016-07-05 VITALS — BP 130/72 | HR 71 | Temp 98.6°F | Resp 23 | Ht 66.0 in | Wt 161.0 lb

## 2016-07-05 DIAGNOSIS — K219 Gastro-esophageal reflux disease without esophagitis: Secondary | ICD-10-CM

## 2016-07-05 DIAGNOSIS — K635 Polyp of colon: Secondary | ICD-10-CM | POA: Diagnosis not present

## 2016-07-05 DIAGNOSIS — Z1212 Encounter for screening for malignant neoplasm of rectum: Secondary | ICD-10-CM | POA: Diagnosis not present

## 2016-07-05 DIAGNOSIS — R1310 Dysphagia, unspecified: Secondary | ICD-10-CM | POA: Diagnosis not present

## 2016-07-05 DIAGNOSIS — D125 Benign neoplasm of sigmoid colon: Secondary | ICD-10-CM | POA: Diagnosis not present

## 2016-07-05 DIAGNOSIS — R1319 Other dysphagia: Secondary | ICD-10-CM

## 2016-07-05 DIAGNOSIS — Z1211 Encounter for screening for malignant neoplasm of colon: Secondary | ICD-10-CM

## 2016-07-05 DIAGNOSIS — K317 Polyp of stomach and duodenum: Secondary | ICD-10-CM | POA: Diagnosis not present

## 2016-07-05 MED ORDER — SODIUM CHLORIDE 0.9 % IV SOLN: 500.0000 mL | INTRAVENOUS | Status: AC

## 2016-07-05 NOTE — Op Note (Signed)
[place] Patient Name: Melissa [name] Procedure Date: 07/05/2016 8:03 AM MRN: [id number] Endoscopist: Ladene Artist , MD Age: 61 Referring MD:  Date of Birth: 12/26/1955 Gender: Female Account #: [id number] Procedure:                Upper GI endoscopy Indications:              Dysphagia, Gastro-esophageal reflux disease Medicines:                Monitored Anesthesia Care Procedure:                Pre-Anesthesia Assessment:                           - Prior to the procedure, a History and Physical                            was performed, and patient medications and                            allergies were reviewed. The patient's tolerance of                            previous anesthesia was also reviewed. The risks                            and benefits of the procedure and the sedation                            options and risks were discussed with the patient.                            All questions were answered, and informed consent                            was obtained. Prior Anticoagulants: The patient has                            taken no previous anticoagulant or antiplatelet                            agents. ASA Grade Assessment: II - A patient with                            mild systemic disease. After reviewing the risks                            and benefits, the patient was deemed in                            satisfactory condition to undergo the procedure.                           After obtaining informed consent, the endoscope was  passed under direct vision. Throughout the                            procedure, the patient's blood pressure, pulse, and                            oxygen saturations were monitored continuously. The                            Model GIF-[id number]) scope was introduced                            through the mouth, and advanced to the second part                            of  duodenum. The upper GI endoscopy was                            accomplished without difficulty. The patient                            tolerated the procedure well. Scope In: Scope Out: Findings:                 No endoscopic abnormality was evident in the                            esophagus to explain the patient's complaint of                            dysphagia. It was decided, however, to proceed with                            dilation of the entire esophagus. A guidewire was                            placed and the scope was withdrawn. Dilation was                            performed with a Savary dilator with no resistance                            at 16 mm. Estimated blood loss: none.                           Multiple 3 to 6 mm sessile polyps with no bleeding                            and no stigmata of recent bleeding were found in                            the gastric fundus and in the gastric body.  Sampling biopsies were taken with a cold forceps                            for histology.                           The exam of the stomach was otherwise normal.                           The duodenal bulb and second portion of the                            duodenum were normal. Complications:            No immediate complications. Estimated Blood Loss:     Estimated blood loss was minimal. Impression:               - No endoscopic esophageal abnormality to explain                            patient's dysphagia. Esophagus dilated. Dilated.                           - Multiple gastric polyps. Biopsied.                           - Normal duodenal bulb and second portion of the                            duodenum. Recommendation:           - Patient has a contact number available for                            emergencies. The signs and symptoms of potential                            delayed complications were discussed with the                             patient. Return to normal activities tomorrow.                            Written discharge instructions were provided to the                            patient.                           - Clear liquid diet today, then advance as                            tolerated to soft diet today. Resume prior diet                            tomorrow.                           -  Continue present medications.                           - Await pathology results. Ladene Artist, MD 07/05/2016 8:37:46 AM This report has been signed electronically.

## 2016-07-05 NOTE — Progress Notes (Signed)
Report to PACU, RN, vss, BBS= Clear.  

## 2016-07-05 NOTE — Patient Instructions (Signed)
YOU HAD AN ENDOSCOPIC PROCEDURE TODAY AT [facility]:   Refer to the procedure report that was given to you for any specific questions about what was found during the examination.  If the procedure report does not answer your questions, please call your gastroenterologist to clarify.  If you requested that your care partner not be given the details of your procedure findings, then the procedure report has been included in a sealed envelope for you to review at your convenience later.  YOU SHOULD EXPECT: Some feelings of bloating in the abdomen. Passage of more gas than usual.  Walking can help get rid of the air that was put into your GI tract during the procedure and reduce the bloating. If you had a lower endoscopy (such as a colonoscopy or flexible sigmoidoscopy) you may notice spotting of blood in your stool or on the toilet paper. If you underwent a bowel prep for your procedure, you may not have a normal bowel movement for a few days.  Please Note:  You might notice some irritation and congestion in your nose or some drainage.  This is from the oxygen used during your procedure.  There is no need for concern and it should clear up in a day or so.  SYMPTOMS TO REPORT IMMEDIATELY:   Following lower endoscopy (colonoscopy or flexible sigmoidoscopy):  Excessive amounts of blood in the stool  Significant tenderness or worsening of abdominal pains  Swelling of the abdomen that is new, acute  Fever of 100F or higher   Following upper endoscopy (EGD)  Vomiting of blood or coffee ground material  New chest pain or pain under the shoulder blades  Painful or persistently difficult swallowing  New shortness of breath  Fever of 100F or higher  Black, tarry-looking stools  For urgent or emergent issues, a gastroenterologist can be reached at any hour by calling [phone].   DIET:  We do recommend a small meal at first, but then you may proceed to your regular diet.  Drink  plenty of fluids but you should avoid alcoholic beverages for 24 hours.  ACTIVITY:  You should plan to take it easy for the rest of today and you should NOT DRIVE or use heavy machinery until tomorrow (because of the sedation medicines used during the test).    FOLLOW UP: Our staff will call the number listed on your records the next business day following your procedure to check on you and address any questions or concerns that you may have regarding the information given to you following your procedure. If we do not reach you, we will leave a message.  However, if you are feeling well and you are not experiencing any problems, there is no need to return our call.  We will assume that you have returned to your regular daily activities without incident.  If any biopsies were taken you will be contacted by phone or by letter within the next 1-3 weeks.  Please call us at [phone] if you have not heard about the biopsies in 3 weeks.   Polyps (handout given) Await for biopsy results to determined next repeat Colonoscopy screening Follow Post Dilatation diet Post Dilation diet (handout given) Esophagitis and Stricture (handout given)   SIGNATURES/CONFIDENTIALITY: You and/or your care partner have signed paperwork which will be entered into your electronic medical record.  These signatures attest to the fact that that the information above on your After Visit Summary has been reviewed and is understood.  Full responsibility of the confidentiality of this discharge information lies with you and/or your care-partner.

## 2016-07-05 NOTE — Op Note (Signed)
[place] [facility] Patient Name: Melissa [name] Procedure Date: 07/05/2016 8:03 AM MRN: [id number] Endoscopist: Ladene Artist , MD Age: 61 Referring MD:  Date of Birth: [birth year] Gender: Female Account #: [id number] Procedure:                Colonoscopy Indications:              Screening for colorectal malignant neoplasm Medicines:                Monitored Anesthesia Care Procedure:                Pre-Anesthesia Assessment:                           - Prior to the procedure, a History and Physical                            was performed, and patient medications and                            allergies were reviewed. The patient's tolerance of                            previous anesthesia was also reviewed. The risks                            and benefits of the procedure and the sedation                            options and risks were discussed with the patient.                            All questions were answered, and informed consent                            was obtained. Prior Anticoagulants: The patient has                            taken no previous anticoagulant or antiplatelet                            agents. ASA Grade Assessment: II - A patient with                            mild systemic disease. After reviewing the risks                            and benefits, the patient was deemed in                            satisfactory condition to undergo the procedure.                           After obtaining informed consent, the colonoscope  was passed under direct vision. Throughout the                            procedure, the patient's blood pressure, pulse, and                            oxygen saturations were monitored continuously. The                            Colonoscope was introduced through the anus and                            advanced to the the cecum, identified by                            appendiceal orifice and  ileocecal valve. The                            ileocecal valve, appendiceal orifice, and rectum                            were photographed. The quality of the bowel                            preparation was excellent. The colonoscopy was                            performed without difficulty. The patient tolerated                            the procedure well. Scope In: 8:11:03 AM Scope Out: 8:25:20 AM Scope Withdrawal Time: 0 hours 10 minutes 21 seconds  Total Procedure Duration: 0 hours 14 minutes 17 seconds  Findings:                 The perianal and digital rectal examinations were                            normal.                           A 6 mm polyp was found in the sigmoid colon. The                            polyp was sessile. The polyp was removed with a                            cold snare. Resection and retrieval were complete.                           The exam was otherwise without abnormality on                            direct and retroflexion views. Complications:  No immediate complications. Estimated blood loss:                            None. Estimated Blood Loss:     Estimated blood loss: none. Impression:               - One 6 mm polyp in the sigmoid colon, removed with                            a cold snare. Resected and retrieved.                           - The examination was otherwise normal on direct                            and retroflexion views. Recommendation:           - Repeat colonoscopy in 5 years for surveillance if                            polyp is precancerous, otherwise 10 years.                           - Patient has a contact number available for                            emergencies. The signs and symptoms of potential                            delayed complications were discussed with the                            patient. Return to normal activities tomorrow.                            Written discharge  instructions were provided to the                            patient.                           - Resume previous diet.                           - Continue present medications.                           - Await pathology results. Ladene Artist, MD 07/05/2016 8:34:33 AM This report has been signed electronically.

## 2016-07-05 NOTE — Progress Notes (Signed)
Called to room to assist during endoscopic procedure.  Patient ID and intended procedure confirmed with present staff. Received instructions for my participation in the procedure from the performing physician.  

## 2016-07-06 ENCOUNTER — Telehealth: Payer: Self-pay

## 2016-07-06 NOTE — Telephone Encounter (Signed)
  Follow up Call-  Call back number 07/05/2016  Post procedure Call Back phone  # [phone] ext [phone]  Permission to leave phone message Yes  Some recent data might be hidden     Patient questions:  Do you have a fever, pain , or abdominal swelling? No. Pain Score  0 *  Have you tolerated food without any problems? Yes.    Have you been able to return to your normal activities? Yes.    Do you have any questions about your discharge instructions: Diet   No. Medications  No. Follow up visit  No.  Do you have questions or concerns about your Care? No.  Actions: * If pain score is 4 or above: No action needed, pain <4.

## 2016-07-06 NOTE — Telephone Encounter (Signed)
Attempted to reach pt. With follow up call following endoscopic procedure yesterday.   LM on pt. Ans. Machine.   Will try to reach pt. Later today.

## 2016-07-16 ENCOUNTER — Encounter: Payer: Self-pay | Admitting: Gastroenterology

## 2016-07-18 ENCOUNTER — Encounter: Payer: Self-pay | Admitting: Gynecology

## 2016-07-31 ENCOUNTER — Other Ambulatory Visit: Payer: Self-pay | Admitting: Endocrinology

## 2016-07-31 DIAGNOSIS — Z1231 Encounter for screening mammogram for malignant neoplasm of breast: Secondary | ICD-10-CM

## 2016-09-01 ENCOUNTER — Other Ambulatory Visit: Payer: Self-pay | Admitting: Endocrinology

## 2016-09-11 ENCOUNTER — Ambulatory Visit
Admission: RE | Admit: 2016-09-11 | Discharge: 2016-09-11 | Disposition: A | Discharge status: Home / Self Care | Payer: BLUE CROSS/BLUE SHIELD | Source: Ambulatory Visit | Attending: Endocrinology | Admitting: Endocrinology

## 2016-09-11 DIAGNOSIS — Z1231 Encounter for screening mammogram for malignant neoplasm of breast: Secondary | ICD-10-CM

## 2016-09-18 ENCOUNTER — Other Ambulatory Visit (INDEPENDENT_AMBULATORY_CARE_PROVIDER_SITE_OTHER): Payer: Self-pay | Admitting: Physical Medicine and Rehabilitation

## 2016-09-18 DIAGNOSIS — M47816 Spondylosis without myelopathy or radiculopathy, lumbar region: Secondary | ICD-10-CM

## 2016-09-19 NOTE — Telephone Encounter (Signed)
Please advise 

## 2016-10-24 ENCOUNTER — Other Ambulatory Visit (INDEPENDENT_AMBULATORY_CARE_PROVIDER_SITE_OTHER): Payer: Self-pay | Admitting: Physical Medicine and Rehabilitation

## 2016-10-24 NOTE — Telephone Encounter (Signed)
Please advise 

## 2016-12-09 ENCOUNTER — Other Ambulatory Visit (INDEPENDENT_AMBULATORY_CARE_PROVIDER_SITE_OTHER): Payer: Self-pay | Admitting: Physical Medicine and Rehabilitation

## 2016-12-09 DIAGNOSIS — M47816 Spondylosis without myelopathy or radiculopathy, lumbar region: Secondary | ICD-10-CM

## 2016-12-10 NOTE — Telephone Encounter (Signed)
Please advise 

## 2017-01-18 ENCOUNTER — Other Ambulatory Visit: Payer: Self-pay

## 2017-01-18 MED ORDER — ZOLPIDEM TARTRATE 5 MG PO TABS: 5.0000 mg | ORAL_TABLET | Freq: Every evening | ORAL | 0 refills | Status: AC | PRN

## 2017-02-06 ENCOUNTER — Other Ambulatory Visit: Payer: Self-pay

## 2017-02-06 MED ORDER — VENLAFAXINE HCL ER 75 MG PO CP24: 150.0000 mg | ORAL_CAPSULE | Freq: Every day | ORAL | 1 refills | Status: DC

## 2017-06-07 ENCOUNTER — Other Ambulatory Visit: Payer: Self-pay

## 2017-06-07 MED ORDER — VENLAFAXINE HCL ER 75 MG PO CP24: 150.0000 mg | ORAL_CAPSULE | Freq: Every day | ORAL | 1 refills | Status: DC

## 2017-10-20 ENCOUNTER — Other Ambulatory Visit: Payer: Self-pay | Admitting: Endocrinology

## 2018-03-08 ENCOUNTER — Other Ambulatory Visit: Payer: Self-pay | Admitting: Endocrinology

## 2018-03-10 NOTE — Telephone Encounter (Signed)
Please refill x 3 months Further refills would have to be considered by new PCP   

## 2018-06-03 ENCOUNTER — Other Ambulatory Visit: Payer: Self-pay | Admitting: Endocrinology

## 2018-07-21 ENCOUNTER — Telehealth: Payer: Self-pay | Admitting: Orthopedic Surgery

## 2018-07-21 MED ORDER — CLINDAMYCIN HCL 300 MG PO CAPS: ORAL_CAPSULE | ORAL | 0 refills | Status: DC

## 2018-07-21 NOTE — Telephone Encounter (Signed)
Ampicillin allergy so I sent in clindamycin

## 2018-07-21 NOTE — Telephone Encounter (Signed)
Please advise. Surgery in 2018.

## 2018-07-21 NOTE — Telephone Encounter (Signed)
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 Patient said the dentist office will call the medication into the pharmacy. Patient said [facility] uses the [facility] in [place]. The number to contact patient is [phone]

## 2018-07-21 NOTE — Telephone Encounter (Signed)
Needs amox 2 g 1 hour before if not allergic

## 2018-08-26 ENCOUNTER — Other Ambulatory Visit: Payer: Self-pay | Admitting: Endocrinology

## 2018-09-11 ENCOUNTER — Other Ambulatory Visit: Payer: Self-pay | Admitting: Endocrinology

## 2018-09-12 NOTE — Telephone Encounter (Signed)
Please refill x 3 months Further refills would have to be considered by new PCP   

## 2018-09-12 NOTE — Telephone Encounter (Signed)
Please advise if you would like to manage and refill

## 2018-11-06 ENCOUNTER — Other Ambulatory Visit: Payer: Self-pay | Admitting: Endocrinology

## 2018-11-07 NOTE — Telephone Encounter (Signed)
Please refill x 3 months Further refills would have to be considered by new PCP   

## 2018-11-07 NOTE — Telephone Encounter (Signed)
Please advise if you wish to refill 

## 2019-01-26 ENCOUNTER — Other Ambulatory Visit: Payer: Self-pay | Admitting: Endocrinology

## 2019-01-27 NOTE — Telephone Encounter (Signed)
Please refill x 1 Further refills would have to be considered by new PCP  

## 2019-01-27 NOTE — Telephone Encounter (Signed)
Please advise 

## 2021-01-03 ENCOUNTER — Telehealth: Payer: Self-pay | Admitting: Orthopedic Surgery

## 2021-01-03 NOTE — Telephone Encounter (Signed)
Received call from pt stating [facility] had requested records and not received them. I advised records were faxed 10/20. I told her I would re-fax. I faxed records [phone]

## 2021-09-26 ENCOUNTER — Telehealth: Payer: Self-pay | Admitting: Orthopedic Surgery

## 2021-09-26 MED ORDER — CLINDAMYCIN HCL 300 MG PO CAPS: ORAL_CAPSULE | ORAL | 0 refills | Status: DC

## 2021-09-26 NOTE — Telephone Encounter (Signed)
IC advised.  

## 2021-09-26 NOTE — Telephone Encounter (Signed)
Pt called stating she has an upcoming dental appt and need dr Marlou Sa to send antibiotics to [facility]. Pt has right knee replaced in 2018.

## 2021-09-26 NOTE — Telephone Encounter (Signed)
Rx submitted. Will notify patient

## 2021-10-03 MED ORDER — CLINDAMYCIN HCL 300 MG PO CAPS: ORAL_CAPSULE | ORAL | 0 refills | Status: AC

## 2021-10-03 NOTE — Addendum Note (Signed)
Addended byLaurann Montana on: 10/03/2021 11:16 AM   Modules accepted: Orders

## 2021-10-03 NOTE — Telephone Encounter (Signed)
Resubmitted. Patient notified.

## 2021-10-03 NOTE — Telephone Encounter (Signed)
Patient called in and Rx Clindamycin  was sent to wrong Pharmacy needs to be resent to [place], [place] - [place]

## 2021-10-10 ENCOUNTER — Telehealth: Payer: Self-pay | Admitting: Orthopedic Surgery

## 2021-10-10 NOTE — Telephone Encounter (Signed)
IC talked with pharmacy. They were able to locate-will fill for patient. Notified patient of this via VM.

## 2021-10-10 NOTE — Telephone Encounter (Signed)
Pt called stating that [facility] still hasnt received script for Cleocin. Please call [place] phone number to [facility] [phone] in [place]. Please call pt when sent or called in again. Pt phone number is [phone].
